# Patient Record
Sex: Male | Born: 1950 | Race: White | Hispanic: No | Marital: Married | Smoking: Never smoker
Health system: Southern US, Community
[De-identification: ages and names within clinical notes are randomized; demographics above are authoritative.]

## PROBLEM LIST (undated history)

## (undated) DIAGNOSIS — T7840XA Allergy, unspecified, initial encounter: Secondary | ICD-10-CM

## (undated) DIAGNOSIS — I10 Essential (primary) hypertension: Secondary | ICD-10-CM

## (undated) DIAGNOSIS — E785 Hyperlipidemia, unspecified: Secondary | ICD-10-CM

## (undated) HISTORY — DX: Hyperlipidemia, unspecified: E78.5

## (undated) HISTORY — DX: Allergy, unspecified, initial encounter: T78.40XA

## (undated) HISTORY — DX: Essential (primary) hypertension: I10

---

## 1996-07-05 HISTORY — PX: CHOLECYSTECTOMY: SHX55

## 2005-06-01 ENCOUNTER — Ambulatory Visit: Payer: Self-pay | Admitting: Urology

## 2006-08-19 ENCOUNTER — Ambulatory Visit: Payer: Self-pay | Admitting: General Surgery

## 2006-08-19 LAB — HM COLONOSCOPY

## 2006-10-25 IMAGING — CT CT ABDOMEN AND PELVIS WITHOUT AND WITH CONTRAST
2 of 4 series · 14 of 32 positions shown, 19 images · non-contrast
Comparison: none

REASON FOR EXAM: lower abd pain
COMMENTS:

[Series 4: with · axial · 0.73mm/px · z∈[-484,-124]mm · 8 of 59 slices shown, 13 images]
[im 7/59  soft-tissue]
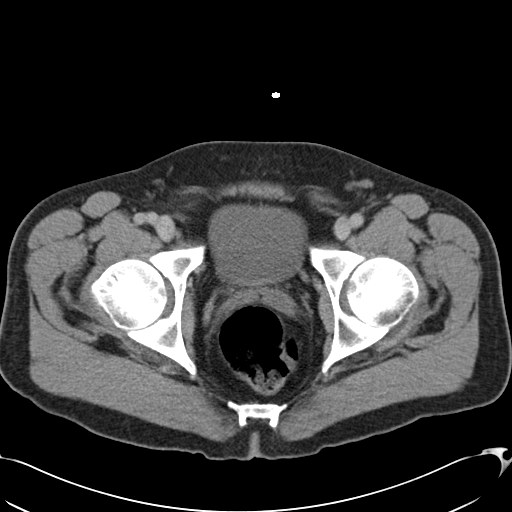
[im 7/59  bone]
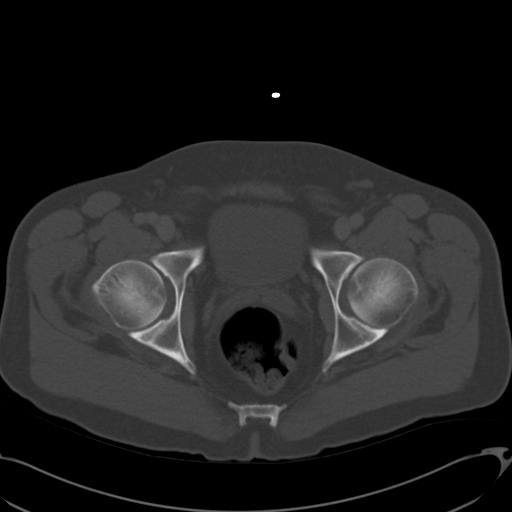
[im 13/59  soft-tissue]
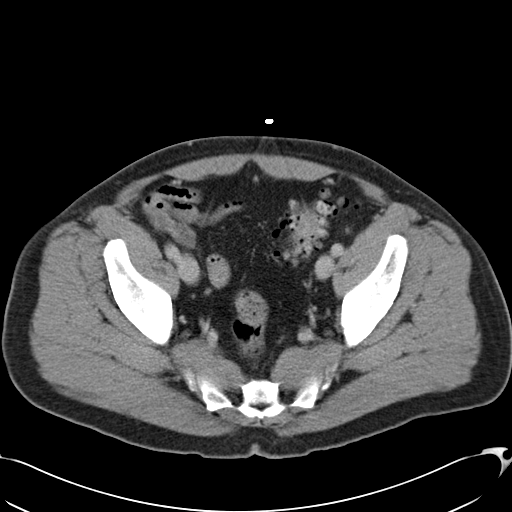
[im 20/59  soft-tissue]
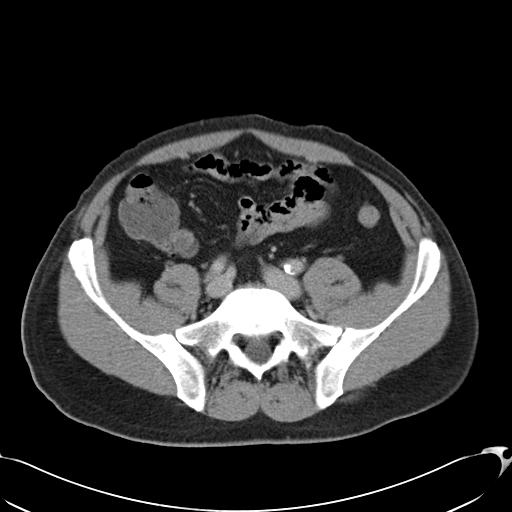
[im 26/59  soft-tissue]
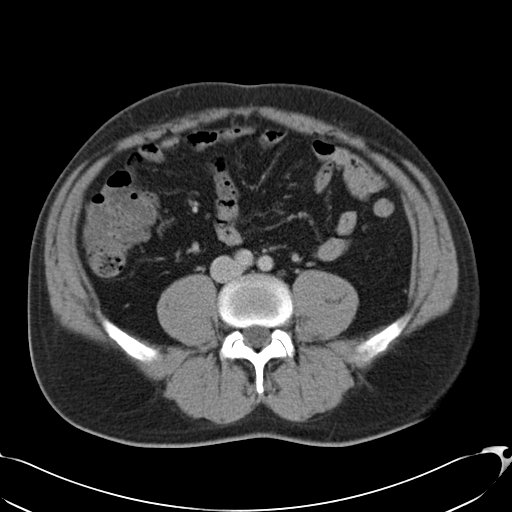
[im 33/59  soft-tissue]
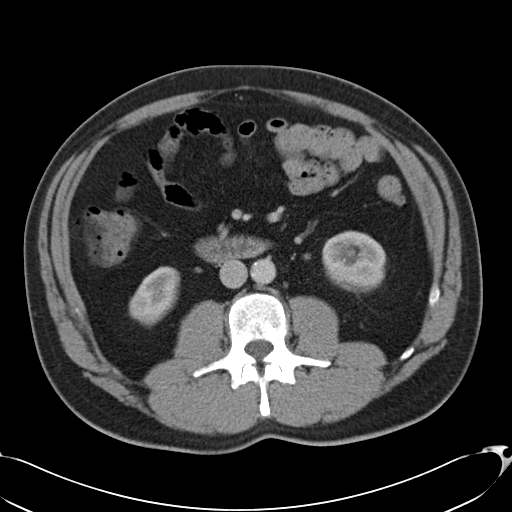
[im 33/59  lung]
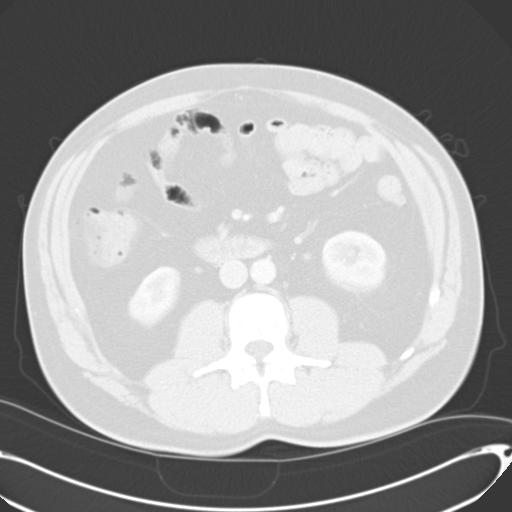
[im 39/59  soft-tissue]
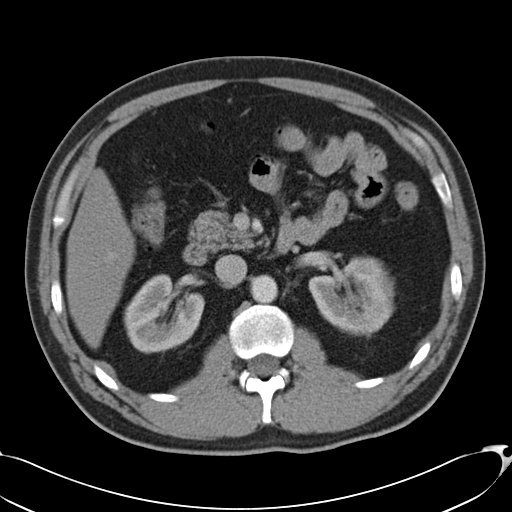
[im 39/59  lung]
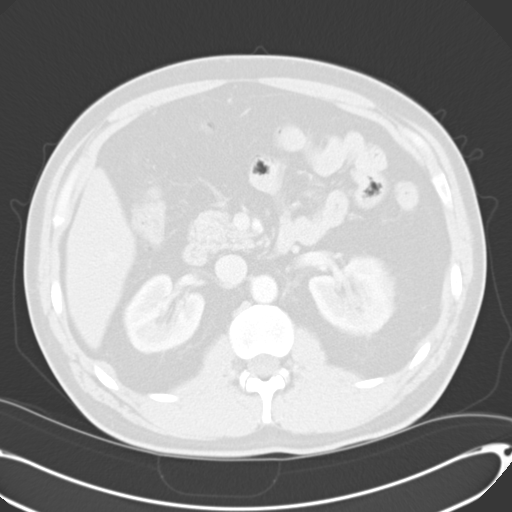
[im 46/59  soft-tissue]
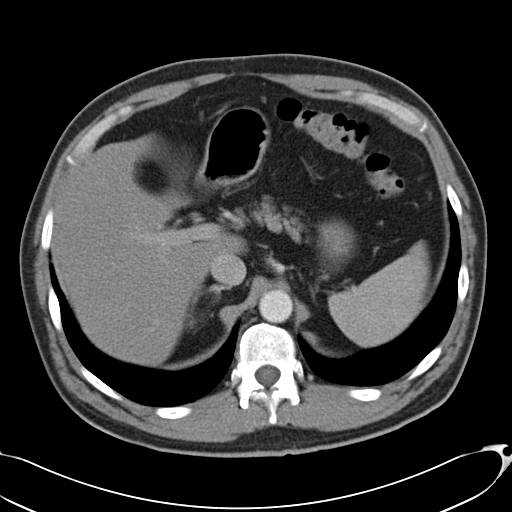
[im 46/59  lung]
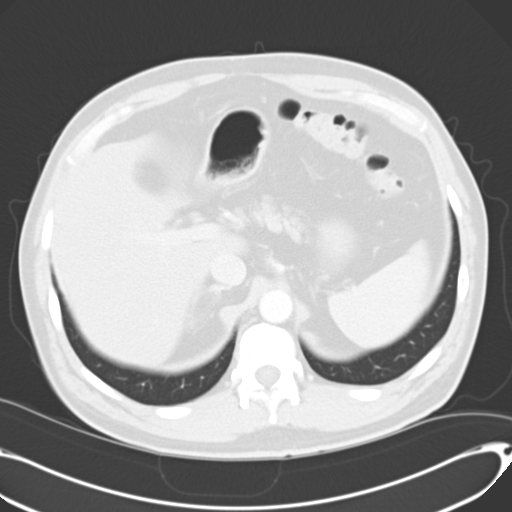
[im 52/59  soft-tissue]
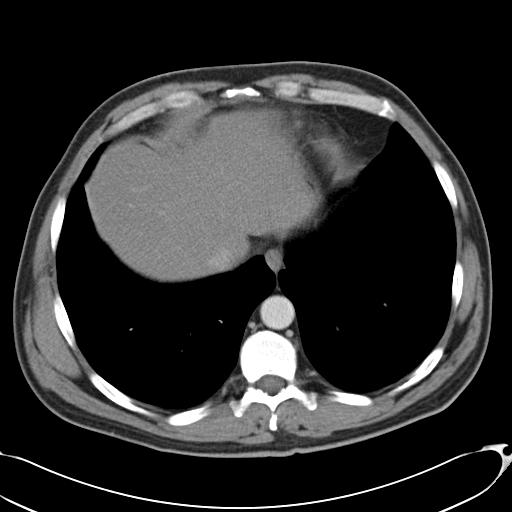
[im 52/59  lung]
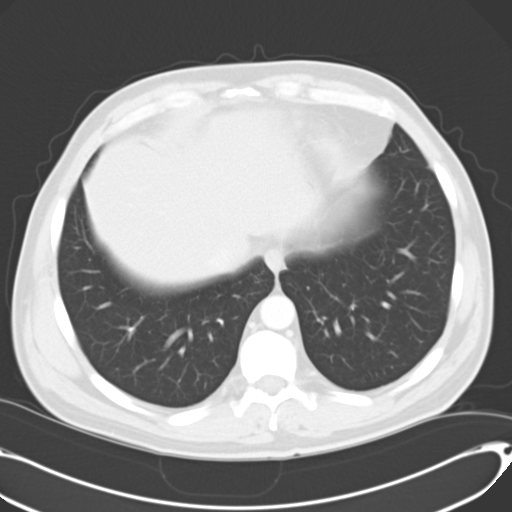

[Series 6: delay · axial · delayed · 0.73mm/px · z∈[-484,-228]mm · 6 of 59 slices shown]
[im 7/59  soft-tissue]
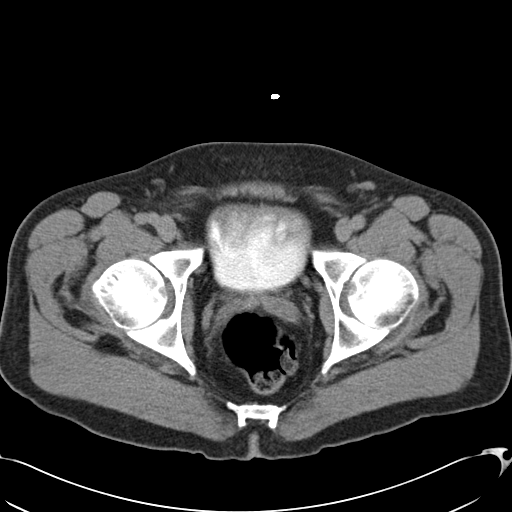
[im 13/59  soft-tissue]
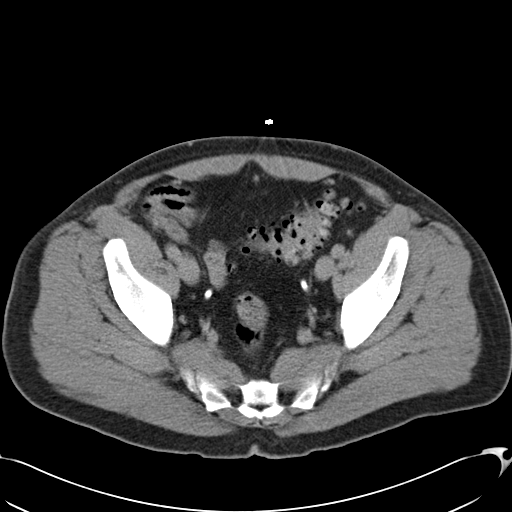
[im 20/59  soft-tissue]
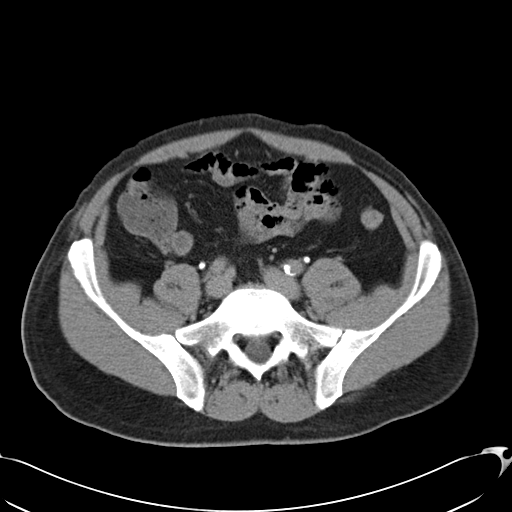
[im 26/59  soft-tissue]
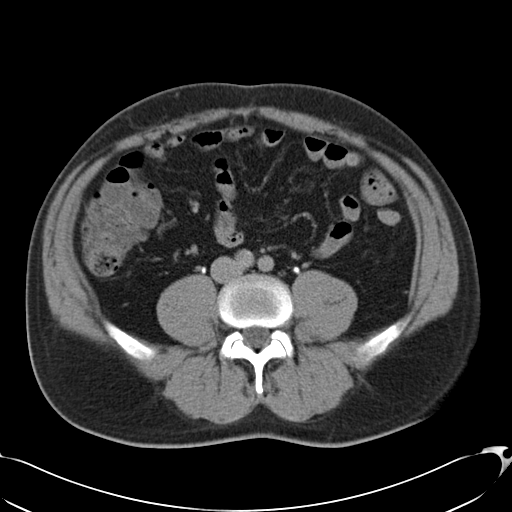
[im 33/59  soft-tissue]
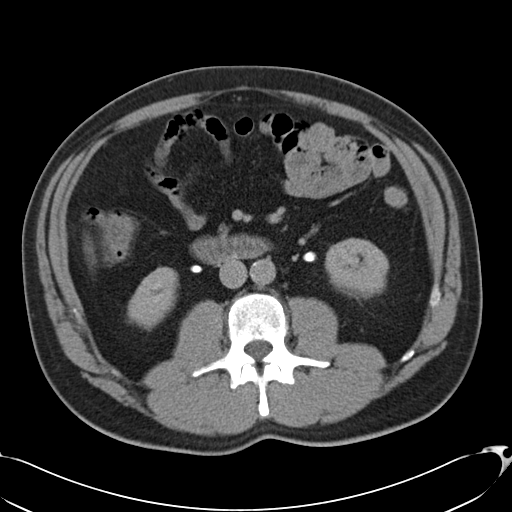
[im 39/59  soft-tissue]
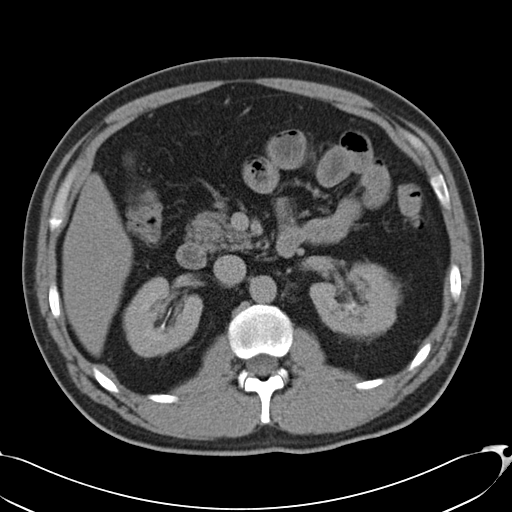

[14 of 32 positions shown; findings below may reference images not displayed]

PROCEDURE:     CT  - CT ABDOMEN / PELVIS  W/WO  - June 01, 2005  [DATE]

RESULT:     The patient has lower abdominal discomfort.  The study is
performed in a triphasic fashion. The liver exhibits normal density with the
exception of an area of irregular calcification inferomedially. This may be
the sequela of prior granulomatous infection. There are surgical clips in
the gallbladder fossa. There is no intrahepatic ductal dilation.  The
spleen, non-distended stomach, pancreas, adrenal glands, and kidneys are
normal in appearance. The periaortic and pericaval regions are normal. I see
no evidence of bowel obstruction or perforation nor inflammatory change.
There is no evidence of ascites. There are numerous diverticula associated
with the sigmoid colon, but I do not see objective evidence of acute
diverticulitis.  Subtle nonspecific thickening of the proximal and mid
sigmoid colon is seen, but this could merely reflect incomplete distention.
The seminal vesicles and prostate gland are normal in appearance as is the
urinary bladder.
IMPRESSION: 1)I do not see objective evidence of acute intraabdominal abnormality. There
is no evidence of urinary tract obstruction nor bowel obstruction.  A subtle
area of decreased density in the mid to lower pole of the RIGHT kidney has
Hounsfield measurements measuring 12 pre-contrast rising to 26 post contrast
and falling to 23 on the delayed images. This is most compatible with a
cyst.

2)There is mild thickening of the wall of the proximal and mid sigmoid
colon. This may merely reflect incomplete distention, but in the face of
numerous diverticula here, a low-grade diverticulitis could be present.

3)I do not see abnormality elsewhere within the abdomen or pelvis.

## 2015-01-18 ENCOUNTER — Ambulatory Visit
Admit: 2015-01-18 | Disposition: A | Discharge status: Home / Self Care | Payer: Self-pay | Attending: Family Medicine | Admitting: Family Medicine

## 2015-01-18 LAB — TSH: TSH: 3.34 u[IU]/mL (ref 0.41–5.90)

## 2015-01-18 LAB — CBC AND DIFFERENTIAL
HCT: 49 % (ref 41–53)
HEMOGLOBIN: 17.3 g/dL (ref 13.5–17.5)
NEUTROS ABS: 5 /uL
PLATELETS: 210 10*3/uL (ref 150–399)
WBC: 7.8 10^3/mL

## 2015-01-18 LAB — BASIC METABOLIC PANEL
BUN: 10 mg/dL (ref 4–21)
Creatinine: 1.2 mg/dL (ref 0.6–1.3)
Glucose: 112 mg/dL
Potassium: 4.8 mmol/L (ref 3.4–5.3)
SODIUM: 145 mmol/L (ref 137–147)

## 2015-01-18 LAB — LIPID PANEL
CHOLESTEROL: 147 mg/dL (ref 0–200)
HDL: 43 mg/dL (ref 35–70)
LDL CALC: 80 mg/dL
LDL/HDL RATIO: 1.9
TRIGLYCERIDES: 120 mg/dL (ref 40–160)

## 2015-01-18 LAB — HEPATIC FUNCTION PANEL
ALK PHOS: 98 U/L (ref 25–125)
ALT: 42 U/L — AB (ref 10–40)
AST: 26 U/L (ref 14–40)
Bilirubin, Total: 0.6 mg/dL

## 2015-01-18 LAB — PSA: PSA: 1.1

## 2015-04-29 ENCOUNTER — Other Ambulatory Visit: Payer: Self-pay | Admitting: Family Medicine

## 2015-05-24 ENCOUNTER — Other Ambulatory Visit: Payer: Self-pay | Admitting: Family Medicine

## 2015-08-22 ENCOUNTER — Ambulatory Visit: Payer: Self-pay | Admitting: Family Medicine

## 2015-08-22 DIAGNOSIS — E7800 Pure hypercholesterolemia, unspecified: Secondary | ICD-10-CM | POA: Insufficient documentation

## 2015-08-22 DIAGNOSIS — E669 Obesity, unspecified: Secondary | ICD-10-CM | POA: Insufficient documentation

## 2015-08-22 DIAGNOSIS — E559 Vitamin D deficiency, unspecified: Secondary | ICD-10-CM | POA: Insufficient documentation

## 2015-08-22 DIAGNOSIS — R7303 Prediabetes: Secondary | ICD-10-CM | POA: Insufficient documentation

## 2015-08-22 DIAGNOSIS — I10 Essential (primary) hypertension: Secondary | ICD-10-CM | POA: Insufficient documentation

## 2015-08-22 DIAGNOSIS — N400 Enlarged prostate without lower urinary tract symptoms: Secondary | ICD-10-CM | POA: Insufficient documentation

## 2015-08-22 DIAGNOSIS — J309 Allergic rhinitis, unspecified: Secondary | ICD-10-CM | POA: Insufficient documentation

## 2015-08-23 ENCOUNTER — Encounter: Payer: Self-pay | Admitting: Family Medicine

## 2015-08-23 ENCOUNTER — Ambulatory Visit (INDEPENDENT_AMBULATORY_CARE_PROVIDER_SITE_OTHER): Payer: PRIVATE HEALTH INSURANCE | Admitting: Family Medicine

## 2015-08-23 VITALS — BP 116/72 | HR 84 | Temp 97.8°F | Resp 16 | Ht 72.0 in | Wt 193.0 lb

## 2015-08-23 DIAGNOSIS — I10 Essential (primary) hypertension: Secondary | ICD-10-CM

## 2015-08-23 DIAGNOSIS — R739 Hyperglycemia, unspecified: Secondary | ICD-10-CM | POA: Diagnosis not present

## 2015-08-23 LAB — POCT GLYCOSYLATED HEMOGLOBIN (HGB A1C): HEMOGLOBIN A1C: 5.9

## 2015-08-23 NOTE — Progress Notes (Signed)
Patient ID: Jack Lutz, male   DOB: 1951-07-11, 64 y.o.   MRN: [id number]   Jack Lutz  MRN: [id number] DOB: Dec 25, 1950  Subjective:  HPI   1. Essential hypertension Patient is a 64 year old male who presents for follow up of his hypertension.  He was last seen on 02/24/15 and no management changes were made at that time.  He is on Lisinopril and reports no adverse effects.  He checks his blood pressure whenever he goes to the pharmacy and reports the readings ranging 120s/80-90s.  2. Hyperglycemia Patient is also here to follow up on elevated glucose that was noted on labs done after his last visit.  His fasting blood sugar was 112mg /dl in May.  He has not had an A1C done yet and is here to have that checked.  He is not on any medications and reports no symptoms suggestive of hypo or hyper glycemia.    Patient Active Problem List   Diagnosis Date Noted  . 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 Essential (primary) hypertension 08/22/2015  . Hypercholesterolemia 08/22/2015  . Adiposity 08/22/2015  . Allergic rhinitis 08/22/2015  . 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 Avitaminosis D 08/22/2015    No past medical history on file.  Social History   Social History  . Marital Status: Married    Spouse Name: N/A  . Number of Children: N/A  . Years of Education: N/A   Occupational History  . Not on file.   Social History Main Topics  . Smoking status: Never Smoker   . Smokeless tobacco: Not on file  . Alcohol Use: 0.0 oz/week    0 Standard drinks or equivalent per week     Comment: 3 per week  . Drug Use: No  . Sexual Activity: Not on file   Other Topics Concern  . Not on file   Social History Narrative    Outpatient Prescriptions Prior to Visit  Medication Sig Dispense Refill  . aspirin 81 MG tablet Take by mouth.    Marland Kitchen atorvastatin (LIPITOR) 40 MG tablet take 1 tablet by mouth once daily 30 tablet 4  . CALCIUM POLYCARBOPHIL PO Take by mouth.    .  Cholecalciferol (VITAMIN D) 2000 UNITS tablet Take by mouth.    . fluticasone (FLONASE) 50 MCG/ACT nasal spray Place into the nose.    Marland Kitchen lisinopril (PRINIVIL,ZESTRIL) 40 MG tablet take 1 tablet by mouth once daily 30 tablet 12  . loratadine (CLARITIN) 10 MG tablet Take by mouth.    . Multiple Vitamin tablet Take by mouth.     No facility-administered medications prior to visit.    No Known Allergies  Review of Systems  Constitutional: Negative.   Eyes: Negative.   Respiratory: Negative.   Cardiovascular: Negative.   Gastrointestinal: Negative.   Genitourinary: Negative for frequency.  Skin: Negative.   Neurological: Negative for dizziness (Some very minimal lightheadedness) and headaches.  Endo/Heme/Allergies: Negative for polydipsia.  Psychiatric/Behavioral: Negative.    Objective:  BP 116/72 mmHg  Pulse 84  Temp(Src) 97.8 F (36.6 C) (Oral)  Resp 16  Ht 6' (1.829 m)  Wt 193 lb (87.544 kg)  BMI 26.17 kg/m2  Physical Exam  Constitutional: He is well-developed, well-nourished, and in no distress.  HENT:  Head: Normocephalic and atraumatic.  Eyes: Conjunctivae and EOM are normal. Pupils are equal, round, and reactive to light.  No nystagmus  Neck: Normal range of motion. Neck supple.  Cardiovascular: Normal rate, regular rhythm and  normal heart sounds.   Pulmonary/Chest: Effort normal and breath sounds normal.  Musculoskeletal:  Small bunion and minimal swelling from history of trauma to the right foot.  Skin: Skin is warm and dry.  Psychiatric: Mood, memory, affect and judgment normal.    Assessment and Plan :   1. Essential hypertension   2. Hyperglycemia  - POCT glycosylated hemoglobin (Hb A1C)   Miguel Aschoff MD [facility] 08/23/2015 11:46 AM

## 2015-10-10 ENCOUNTER — Other Ambulatory Visit: Payer: Self-pay | Admitting: Family Medicine

## 2015-12-07 ENCOUNTER — Other Ambulatory Visit: Payer: Self-pay

## 2015-12-07 MED ORDER — ATORVASTATIN CALCIUM 40 MG PO TABS: 40.0000 mg | ORAL_TABLET | Freq: Every day | ORAL | Status: DC

## 2016-02-21 ENCOUNTER — Ambulatory Visit (INDEPENDENT_AMBULATORY_CARE_PROVIDER_SITE_OTHER): Payer: Medicare Other | Admitting: Family Medicine

## 2016-02-21 ENCOUNTER — Encounter: Payer: Self-pay | Admitting: Family Medicine

## 2016-02-21 VITALS — BP 134/68 | HR 64 | Temp 97.6°F | Resp 16 | Ht 72.0 in | Wt 192.0 lb

## 2016-02-21 DIAGNOSIS — Z23 Encounter for immunization: Secondary | ICD-10-CM | POA: Diagnosis not present

## 2016-02-21 DIAGNOSIS — M545 Low back pain: Secondary | ICD-10-CM

## 2016-02-21 DIAGNOSIS — Z0000 Encounter for general adult medical examination without abnormal findings: Secondary | ICD-10-CM

## 2016-02-21 MED ORDER — NAPROXEN 500 MG PO TABS: 500.0000 mg | ORAL_TABLET | Freq: Two times a day (BID) | ORAL | Status: DC

## 2016-02-21 NOTE — Progress Notes (Signed)
Patient ID: Jack Lutz, male   DOB: 1951-01-01, 65 y.o.   MRN: [id number] Visit Date: 02/21/2016  Today's Provider: Wilhemena Durie, MD   Chief Complaint  Patient presents with  . Medicare Wellness  . Hyperlipidemia  . Hypertension   Subjective:   Jack Lutz is a 65 y.o. male who presents today for his Subsequent Annual Wellness Visit. He feels well. He reports exercising daily. He reports he is sleeping well.  Colonoscopy- 08/19/06 Diverticulosis petechial mucosa in the sigmoid colon repeat 10 years  Pt is due for Pneumovax-23  Immunization History  Administered Date(s) Administered  . Pneumococcal Conjugate-13 01/18/2015  . Tdap 10/30/2010  . Zoster 10/30/2010      Review of Systems  Constitutional: Negative.   HENT: Negative.   Eyes: Negative.   Respiratory: Negative.   Cardiovascular: Negative.   Gastrointestinal: Negative.   Endocrine: Negative.   Genitourinary: Negative.   Musculoskeletal: Negative.   Skin: Negative.   Allergic/Immunologic: Negative.   Neurological: Negative.   Hematological: Negative.   Psychiatric/Behavioral: Negative.     Patient Active Problem List   Diagnosis Date Noted  . 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 Essential (primary) hypertension 08/22/2015  . Hypercholesterolemia 08/22/2015  . Adiposity 08/22/2015  . Allergic rhinitis 08/22/2015  . 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 Avitaminosis D 08/22/2015    Social History   Social History  . Marital Status: Married    Spouse Name: N/A  . Number of Children: N/A  . Years of Education: N/A   Occupational History  . Not on file.   Social History Main Topics  . Smoking status: Never Smoker   . Smokeless tobacco: Not on file  . Alcohol Use: 0.0 oz/week    0 Standard drinks or equivalent per week     Comment: 3 per week- occasionally  . Drug Use: No  . Sexual Activity: Not on file   Other Topics Concern  . Not on file   Social History Narrative     Past Surgical History  Procedure Laterality Date  . Cholecystectomy      His family history includes Emphysema in his mother; Heart attack in his brother, brother, father, and sister; Heart disease in his mother and sister; Other in his brother.    Outpatient Prescriptions Prior to Visit  Medication Sig Dispense Refill  . aspirin 81 MG tablet Take by mouth.    Marland Kitchen atorvastatin (LIPITOR) 40 MG tablet Take 1 tablet (40 mg total) by mouth daily. 90 tablet 3  . CALCIUM POLYCARBOPHIL PO Take by mouth.    . Cholecalciferol (VITAMIN D) 2000 UNITS tablet Take by mouth.    . Coenzyme Q10 (CO Q-10) 100 MG CAPS Take 1 capsule by mouth daily.    . fluticasone (FLONASE) 50 MCG/ACT nasal spray instill 2 sprays into each nostril once daily 16 g 12  . lisinopril (PRINIVIL,ZESTRIL) 40 MG tablet take 1 tablet by mouth once daily 30 tablet 12  . loratadine (CLARITIN) 10 MG tablet Take by mouth.    . Multiple Vitamin tablet Take by mouth.     No facility-administered medications prior to visit.    No Known Allergies  Patient Care Team: Jerrol Banana., MD as PCP - General (Family Medicine)  Objective:   Vitals:  Filed Vitals:   02/21/16 1025  BP: 134/68  Pulse: 64  Temp: 97.6 F (36.4 C)  TempSrc: Oral  Resp: 16  Height: 6' (1.829 m)  Weight: 192 lb (  87.091 kg)    Physical Exam  Constitutional: He is oriented to person, place, and time. He appears well-developed and well-nourished.  HENT:  Head: Normocephalic and atraumatic.  Right Ear: External ear normal.  Left Ear: External ear normal.  Nose: Nose normal.  Mouth/Throat: Oropharynx is clear and moist.  Eyes: Conjunctivae and EOM are normal. Pupils are equal, round, and reactive to light.  Neck: Normal range of motion. Neck supple.  Cardiovascular: Normal rate, regular rhythm, normal heart sounds and intact distal pulses.   Pulmonary/Chest: Effort normal and breath sounds normal.  Abdominal: Soft. Bowel sounds are normal.   Genitourinary: Rectum normal, prostate normal and penis normal.  Musculoskeletal: Normal range of motion.  Neurological: He is alert and oriented to person, place, and time. He has normal reflexes.  Skin: Skin is warm and dry.  Psychiatric: He has a normal mood and affect. His behavior is normal. Judgment and thought content normal.    Activities of Daily Living In your present state of health, do you have any difficulty performing the following activities: 02/21/2016  Hearing? N  Vision? N  Difficulty concentrating or making decisions? N  Walking or climbing stairs? N  Dressing or bathing? N  Doing errands, shopping? N    Fall Risk Assessment Fall Risk  02/21/2016  Falls in the past year? No     Depression Screen PHQ 2/9 Scores 02/21/2016  PHQ - 2 Score 0    Cognitive Testing - 6-CIT    Year: 0  points  Month: 0  points  Memorize "Pia Mau, [address] [place]"  Time (within 1 hour:) 0  points  Count backwards from 20: 0  points  Name months of year: 0 points  Repeat Address: 2 points   Total Score: 2/28  Interpretation : Normal (0-7) Abnormal (8-28)    Assessment & Plan:     Annual Wellness Visit  Reviewed patient's Family Medical History Reviewed and updated list of patient's medical providers Assessment of cognitive impairment was done Assessed patient's functional ability Established a written schedule for health screening Jack Lutz Completed and Reviewed  Patient overall feels well. He retired work as a 10/09/2015 is and is enjoying retirement. Immunization History  Administered Date(s) Administered  . Pneumococcal Conjugate-13 01/18/2015  . Tdap 10/30/2010  . Zoster 10/30/2010    1. Medicare annual wellness visit, initial  - EKG 12-Lead - Visual acuity screening  2. Need for pneumococcal vaccination - Pneumococcal polysaccharide vaccine 23-valent greater than or equal to 2yo subcutaneous/IM  3. Low back pain,  unspecified back pain laterality, with sciatica presence unspecified - naproxen (NAPROSYN) 500 MG tablet; Take 1 tablet (500 mg total) by mouth 2 (two) times daily with a meal.  Dispense: 60 tablet; Refill: 5    Discussed health benefits of physical activity, and encouraged him to engage in regular exercise appropriate for his age and condition.    Miguel Aschoff MD [facility] 02/21/2016 10:30 AM  ------------------------------------------------------------------------------------------------------------

## 2016-05-03 ENCOUNTER — Ambulatory Visit (INDEPENDENT_AMBULATORY_CARE_PROVIDER_SITE_OTHER): Payer: Medicare Other | Admitting: Family Medicine

## 2016-05-03 VITALS — BP 140/82 | HR 72 | Temp 97.5°F | Wt 192.0 lb

## 2016-05-03 DIAGNOSIS — E7800 Pure hypercholesterolemia, unspecified: Secondary | ICD-10-CM

## 2016-05-03 DIAGNOSIS — R7303 Prediabetes: Secondary | ICD-10-CM

## 2016-05-03 DIAGNOSIS — L723 Sebaceous cyst: Secondary | ICD-10-CM | POA: Diagnosis not present

## 2016-05-03 DIAGNOSIS — I10 Essential (primary) hypertension: Secondary | ICD-10-CM | POA: Diagnosis not present

## 2016-05-03 DIAGNOSIS — M791 Myalgia, unspecified site: Secondary | ICD-10-CM

## 2016-05-03 NOTE — Progress Notes (Signed)
Jack Lutz  MRN: [id number] DOB: May 24, 1951  Subjective:  HPI   The patient is a 65 year old male who presents for follow up of chronic issues.  He was last seen in May for his Annual Wellness Exam, [facility] to [employer].   Hypertension- the patient checks his pressure at the pharmacy when he goes and reports his readings 120-130's over 80s with an occasional 90.  His pressure on his last visit with [place] was 134/68.  BP Readings from Last 3 Encounters:  05/03/16 140/82  02/21/16 134/68  08/23/15 116/72   Lab Results  Component Value Date   CREATININE 1.2 01/18/2015   BUN 10 01/18/2015   NA 145 01/18/2015   K 4.8 01/18/2015   He has also been diagnosed with borderline diabetes.  He does not check his glucose at home. Lab Results  Component Value Date   HGBA1C 5.9 08/23/2015   Patient is also on cholesterol medications and needs to have this checked today.  Lab Results  Component Value Date   CHOL 147 01/18/2015   Lab Results  Component Value Date   HDL 43 01/18/2015   Lab Results  Component Value Date   LDLCALC 80 01/18/2015   Lab Results  Component Value Date   TRIG 120 01/18/2015     Patient Active Problem List   Diagnosis Date Noted  . 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 Essential (primary) hypertension 08/22/2015  . Hypercholesterolemia 08/22/2015  . Adiposity 08/22/2015  . Allergic rhinitis 08/22/2015  . 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 Avitaminosis D 08/22/2015    No past medical history on file.  Social History   Social History  . Marital status: Married    Spouse name: N/A  . Number of children: N/A  . Years of education: N/A   Occupational History  . Not on file.   Social History Main Topics  . Smoking status: Never Smoker  . Smokeless tobacco: Not on file  . Alcohol use 0.0 oz/week     Comment: 3 per week- occasionally  . Drug use: No  . Sexual activity: Not on file   Other Topics  Concern  . Not on file   Social History Narrative  . No narrative on file    Outpatient Medications Prior to Visit  Medication Sig Dispense Refill  . aspirin 81 MG tablet Take by mouth.    Marland Kitchen atorvastatin (LIPITOR) 40 MG tablet Take 1 tablet (40 mg total) by mouth daily. 90 tablet 3  . CALCIUM POLYCARBOPHIL PO Take by mouth.    . Cholecalciferol (VITAMIN D) 2000 UNITS tablet Take by mouth.    . Coenzyme Q10 (CO Q-10) 100 MG CAPS Take 1 capsule by mouth daily.    . fluticasone (FLONASE) 50 MCG/ACT nasal spray instill 2 sprays into each nostril once daily 16 g 12  . lisinopril (PRINIVIL,ZESTRIL) 40 MG tablet take 1 tablet by mouth once daily 30 tablet 12  . loratadine (CLARITIN) 10 MG tablet Take by mouth.    . Multiple Vitamin tablet Take by mouth.    . naproxen (NAPROSYN) 500 MG tablet Take 1 tablet (500 mg total) by mouth 2 (two) times daily with a meal. 60 tablet 5   No facility-administered medications prior to visit.     No Known Allergies  Review of Systems  Constitutional: Negative for chills, fever and malaise/fatigue.  Respiratory: Negative for cough, shortness of breath and wheezing.   Cardiovascular: Negative  for chest pain, palpitations, orthopnea, claudication, leg swelling and PND.  Genitourinary: Negative for urgency.  Neurological: Negative for dizziness, weakness and headaches.  Endo/Heme/Allergies: Negative.  Negative for polydipsia.  Psychiatric/Behavioral: Negative.    Objective:  BP 140/82 (BP Location: Right Arm, Patient Position: Sitting, Cuff Size: Normal)   Pulse 72   Temp 97.5 F (36.4 C) (Oral)   Wt 192 lb (87.1 kg)   BMI 26.04 kg/m   Physical Exam  Constitutional: He is oriented to person, place, and time and well-developed, well-nourished, and in no distress.  HENT:  Head: Normocephalic and atraumatic.  Right Ear: External ear normal.  Left Ear: External ear normal.  Nose: Nose normal.  Eyes: Conjunctivae are normal. Pupils are equal,  round, and reactive to light.  Neck: Normal range of motion.  Cardiovascular: Normal rate, regular rhythm, normal heart sounds and intact distal pulses.   Pulmonary/Chest: Effort normal and breath sounds normal.  Neurological: He is alert and oriented to person, place, and time. Gait normal.  Skin: Skin is warm and dry.  Sebaceous Cyst present on left posterior rib area.  Psychiatric: Mood, memory, affect and judgment normal.    Assessment and Plan :   1. Essential (primary) hypertension Stable continue current medication.  - CBC with Differential/Platelet - Comprehensive metabolic panel - TSH  2. Hypercholesterolemia Stable on last check, will check today.  - Lipid Panel With LDL/HDL Ratio  3. Borderline diabetes Will obtain A1C today.  - Hemoglobin A1c  4. Myalgia Will check CK today.  - CK  5. Sebaceous cyst Small cyst on left back, patient declined treatment today.   I have done the exam and reviewed the above chart and it is accurate to the best of my knowledge.  Patient was seen and examined by Dr. Delfino Lovett L. [place] Mon and the note was scribed by Althea Charon, RMA.  Miguel Aschoff MD [facility] 05/03/2016 8:21 AM

## 2016-05-04 ENCOUNTER — Other Ambulatory Visit: Payer: Self-pay | Admitting: Family Medicine

## 2016-05-04 LAB — CBC WITH DIFFERENTIAL/PLATELET
BASOS ABS: 0 10*3/uL (ref 0.0–0.2)
BASOS: 0 %
EOS (ABSOLUTE): 0.1 10*3/uL (ref 0.0–0.4)
Eos: 2 %
Hematocrit: 49.6 % (ref 37.5–51.0)
Hemoglobin: 16.6 g/dL (ref 12.6–17.7)
IMMATURE GRANS (ABS): 0 10*3/uL (ref 0.0–0.1)
Immature Granulocytes: 0 %
LYMPHS ABS: 1.9 10*3/uL (ref 0.7–3.1)
LYMPHS: 25 %
MCH: 30.8 pg (ref 26.6–33.0)
MCHC: 33.5 g/dL (ref 31.5–35.7)
MCV: 92 fL (ref 79–97)
Monocytes Absolute: 0.5 10*3/uL (ref 0.1–0.9)
Monocytes: 7 %
NEUTROS ABS: 5 10*3/uL (ref 1.4–7.0)
Neutrophils: 66 %
PLATELETS: 217 10*3/uL (ref 150–379)
RBC: 5.39 x10E6/uL (ref 4.14–5.80)
RDW: 13.4 % (ref 12.3–15.4)
WBC: 7.6 10*3/uL (ref 3.4–10.8)

## 2016-05-04 LAB — LIPID PANEL WITH LDL/HDL RATIO
CHOLESTEROL TOTAL: 105 mg/dL (ref 100–199)
HDL: 42 mg/dL (ref >39)
LDL CALC: 49 mg/dL (ref 0–99)
LDl/HDL Ratio: 1.2 ratio units (ref 0.0–3.6)
TRIGLYCERIDES: 71 mg/dL (ref 0–149)
VLDL CHOLESTEROL CAL: 14 mg/dL (ref 5–40)

## 2016-05-04 LAB — COMPREHENSIVE METABOLIC PANEL
ALK PHOS: 84 IU/L (ref 39–117)
ALT: 24 IU/L (ref 0–44)
AST: 19 IU/L (ref 0–40)
Albumin/Globulin Ratio: 2 (ref 1.2–2.2)
Albumin: 4.6 g/dL (ref 3.6–4.8)
BILIRUBIN TOTAL: 0.7 mg/dL (ref 0.0–1.2)
BUN/Creatinine Ratio: 14 (ref 10–24)
BUN: 15 mg/dL (ref 8–27)
CHLORIDE: 100 mmol/L (ref 96–106)
CO2: 22 mmol/L (ref 18–29)
CREATININE: 1.1 mg/dL (ref 0.76–1.27)
Calcium: 9.8 mg/dL (ref 8.6–10.2)
GFR calc Af Amer: 80 mL/min/{1.73_m2} (ref >59)
GFR calc non Af Amer: 70 mL/min/{1.73_m2} (ref >59)
GLUCOSE: 110 mg/dL — AB (ref 65–99)
Globulin, Total: 2.3 g/dL (ref 1.5–4.5)
Potassium: 4.7 mmol/L (ref 3.5–5.2)
Sodium: 138 mmol/L (ref 134–144)
Total Protein: 6.9 g/dL (ref 6.0–8.5)

## 2016-05-04 LAB — CK: Total CK: 132 U/L (ref 24–204)

## 2016-05-04 LAB — HEMOGLOBIN A1C
ESTIMATED AVERAGE GLUCOSE: 134 mg/dL
HEMOGLOBIN A1C: 6.3 % — AB (ref 4.8–5.6)

## 2016-05-04 LAB — TSH: TSH: 3.73 u[IU]/mL (ref 0.450–4.500)

## 2016-05-11 ENCOUNTER — Telehealth: Payer: Self-pay | Admitting: Family Medicine

## 2016-05-11 NOTE — Telephone Encounter (Signed)
Pt called wanting his lab results from last week.  He has not gotten a call back regarding the results.  His call back is [phone]  Thanks Con Memos

## 2016-05-11 NOTE — Telephone Encounter (Signed)
Pt advised of lab results.   Thanks,   -Laura  

## 2016-07-23 DIAGNOSIS — Z23 Encounter for immunization: Secondary | ICD-10-CM | POA: Diagnosis not present

## 2016-10-30 ENCOUNTER — Other Ambulatory Visit: Payer: Self-pay | Admitting: Family Medicine

## 2016-11-08 ENCOUNTER — Encounter: Payer: Self-pay | Admitting: Family Medicine

## 2016-11-08 ENCOUNTER — Ambulatory Visit (INDEPENDENT_AMBULATORY_CARE_PROVIDER_SITE_OTHER): Payer: Medicare Other | Admitting: Family Medicine

## 2016-11-08 VITALS — BP 122/64 | HR 88 | Temp 98.7°F | Resp 16 | Wt 180.0 lb

## 2016-11-08 DIAGNOSIS — R059 Cough, unspecified: Secondary | ICD-10-CM

## 2016-11-08 DIAGNOSIS — R091 Pleurisy: Secondary | ICD-10-CM

## 2016-11-08 DIAGNOSIS — R05 Cough: Secondary | ICD-10-CM

## 2016-11-08 MED ORDER — HYDROCODONE-ACETAMINOPHEN 5-325 MG PO TABS: 1.0000 | ORAL_TABLET | ORAL | 0 refills | Status: DC | PRN

## 2016-11-08 MED ORDER — AZITHROMYCIN 250 MG PO TABS: ORAL_TABLET | ORAL | 1 refills | Status: DC

## 2016-11-08 NOTE — Progress Notes (Signed)
Patient: Jack Lutz Male    DOB: [birth year]   66 y.o.   MRN: [id number] Visit Date: 11/08/2016  Today's Provider: Wilhemena Durie, MD   Chief Complaint  Patient presents with  . URI    X 2 weeks.    Subjective:    URI   This is a new problem. The current episode started 1 to 4 weeks ago. The problem has been unchanged. Associated symptoms include congestion, coughing, headaches, rhinorrhea and sneezing. Pertinent negatives include no wheezing. He has tried decongestant and antihistamine for the symptoms. The treatment provided mild relief.       No Known Allergies   Current Outpatient Prescriptions:  .  aspirin 81 MG tablet, Take by mouth., Disp: , Rfl:  .  atorvastatin (LIPITOR) 40 MG tablet, Take 1 tablet (40 mg total) by mouth daily., Disp: 90 tablet, Rfl: 3 .  CALCIUM POLYCARBOPHIL PO, Take by mouth., Disp: , Rfl:  .  Cholecalciferol (VITAMIN D) 2000 UNITS tablet, Take by mouth., Disp: , Rfl:  .  Coenzyme Q10 (CO Q-10) 100 MG CAPS, Take 1 capsule by mouth daily., Disp: , Rfl:  .  fluticasone (FLONASE) 50 MCG/ACT nasal spray, instill 2 sprays into each nostril once daily, Disp: 16 g, Rfl: 12 .  lisinopril (PRINIVIL,ZESTRIL) 40 MG tablet, take 1 tablet by mouth once daily, Disp: 30 tablet, Rfl: 12 .  loratadine (CLARITIN) 10 MG tablet, Take by mouth., Disp: , Rfl:  .  Multiple Vitamin tablet, Take by mouth., Disp: , Rfl:  .  naproxen (NAPROSYN) 500 MG tablet, Take 1 tablet (500 mg total) by mouth 2 (two) times daily with a meal., Disp: 60 tablet, Rfl: 5  Review of Systems  HENT: Positive for congestion, rhinorrhea and sneezing.   Eyes: Negative.   Respiratory: Positive for cough. Negative for wheezing.   Gastrointestinal: Negative.   Endocrine: Negative.   Allergic/Immunologic: Negative.   Neurological: Positive for headaches.  Psychiatric/Behavioral: Negative.     Social History  Substance Use Topics  . Smoking status: Never Smoker  . Smokeless  tobacco: Never Used  . Alcohol use 0.0 oz/week     Comment: 3 per week- occasionally   Objective:   BP 122/64 (BP Location: Right Arm, Patient Position: Sitting, Cuff Size: Normal)   Pulse 88   Temp 98.7 F (37.1 C)   Resp 16   Wt 180 lb (81.6 kg)   SpO2 98%   BMI 24.41 kg/m   Physical Exam  Constitutional: He is oriented to person, place, and time. He appears well-developed and well-nourished.  HENT:  Head: Normocephalic and atraumatic.  Right Ear: External ear normal.  Left Ear: External ear normal.  Nose: Nose normal.  Mouth/Throat: Oropharynx is clear and moist.  Neck: Normal range of motion. Neck supple.  Cardiovascular: Normal rate, regular rhythm and normal heart sounds.   Pulmonary/Chest: Effort normal and breath sounds normal.  Neurological: He is alert and oriented to person, place, and time.  Skin: Skin is warm and dry.  Psychiatric: He has a normal mood and affect. His behavior is normal. Judgment and thought content normal.        Assessment & Plan:     1. Cough  - azithromycin (ZITHROMAX) 250 MG tablet; Take 2 tablets today, then 1 tablet thereafter.  Dispense: 6 tablet; Refill: 1 - HYDROcodone-acetaminophen (NORCO/VICODIN) 5-325 MG tablet; Take 1 tablet by mouth every 4 (four) hours as needed for moderate pain.  Dispense: 30  tablet; Refill: 0  2. Pleurisy 3.Bronchitis Zpak.  I have done the exam and reviewed the chart and it is accurate to the best of my knowledge. Development worker, community has been used and  any errors in dictation or transcription are unintentional. Miguel Aschoff M.D. [place], MD  [facility]

## 2016-11-08 NOTE — Patient Instructions (Signed)
Take naproxen and tylenol for headache.

## 2016-11-22 ENCOUNTER — Other Ambulatory Visit: Payer: Self-pay | Admitting: Family Medicine

## 2017-02-26 ENCOUNTER — Encounter: Payer: Self-pay | Admitting: Family Medicine

## 2017-02-26 ENCOUNTER — Ambulatory Visit (INDEPENDENT_AMBULATORY_CARE_PROVIDER_SITE_OTHER): Payer: Medicare Other | Admitting: Family Medicine

## 2017-02-26 VITALS — BP 108/70 | HR 56 | Temp 98.0°F | Resp 16 | Wt 179.0 lb

## 2017-02-26 DIAGNOSIS — E7800 Pure hypercholesterolemia, unspecified: Secondary | ICD-10-CM

## 2017-02-26 DIAGNOSIS — R7303 Prediabetes: Secondary | ICD-10-CM | POA: Diagnosis not present

## 2017-02-26 DIAGNOSIS — I10 Essential (primary) hypertension: Secondary | ICD-10-CM

## 2017-02-26 NOTE — Progress Notes (Signed)
Patient: Jack Lutz Male    DOB: 06-16-1951   66 y.o.   MRN: [id number] Visit Date: 02/26/2017  Today's Provider: Wilhemena Durie, MD   Chief Complaint  Patient presents with  . Hypertension  . Hyperlipidemia  . Hyperglycemia   Subjective:    HPI  Hypertension, follow-up:  BP Readings from Last 3 Encounters:  02/26/17 108/70  11/08/16 122/64  05/03/16 140/82    He was last seen for hypertension 10 months ago.  BP at that visit was 122/64. Management since that visit includes no changes. He reports good compliance with treatment. He is not having side effects.  He is not exercising. However, the patient does stay active. He is adherent to low salt diet.   Outside blood pressures are checked occasionally. He is experiencing none.  Patient denies exertional chest pressure/discomfort, lower extremity edema and palpitations.   Cardiovascular risk factors include dyslipidemia.   Weight trend: stable Wt Readings from Last 3 Encounters:  02/26/17 179 lb (81.2 kg)  11/08/16 180 lb (81.6 kg)  05/03/16 192 lb (87.1 kg)    Current diet: well balanced    Lipid/Cholesterol, Follow-up:   Last seen for this10 months ago.  Management changes since that visit include no changes. . Last Lipid Panel:    Component Value Date/Time   CHOL 105 05/03/2016 0916   TRIG 71 05/03/2016 0916   HDL 42 05/03/2016 0916   LDLCALC 49 05/03/2016 0916    Risk factors for vascular disease include hypertension  He reports good compliance with treatment. He is not having side effects.  Current symptoms include none and have been stable. Weight trend: stable Prior visit with dietician: no Current diet: well balanced Current exercise: no regular exercise  Wt Readings from Last 3 Encounters:  02/26/17 179 lb (81.2 kg)  11/08/16 180 lb (81.6 kg)  05/03/16 192 lb (87.1 kg)      Prediabetes, Follow-up:   Lab Results  Component Value Date   HGBA1C 6.3 (H) 05/03/2016     HGBA1C 5.9 08/23/2015   GLUCOSE 110 (H) 05/03/2016    Last seen for for this10 months ago.  Management since that visit includes increase exercise and monitor diet. Current symptoms include none and have been stable.  Weight trend: stable Prior visit with dietician: no Current diet: well balanced Current exercise: no regular exercise  Pertinent Labs:    Component Value Date/Time   CHOL 105 05/03/2016 0916   TRIG 71 05/03/2016 0916   CREATININE 1.10 05/03/2016 0916    Wt Readings from Last 3 Encounters:  02/26/17 179 lb (81.2 kg)  11/08/16 180 lb (81.6 kg)  05/03/16 192 lb (87.1 kg)         No Known Allergies   Current Outpatient Prescriptions:  .  aspirin 81 MG tablet, Take by mouth., Disp: , Rfl:  .  atorvastatin (LIPITOR) 40 MG tablet, take 1 tablet by mouth once daily, Disp: 90 tablet, Rfl: 3 .  CALCIUM POLYCARBOPHIL PO, Take by mouth., Disp: , Rfl:  .  Cholecalciferol (VITAMIN D) 2000 UNITS tablet, Take by mouth., Disp: , Rfl:  .  Coenzyme Q10 (CO Q-10) 100 MG CAPS, Take 1 capsule by mouth daily., Disp: , Rfl:  .  fluticasone (FLONASE) 50 MCG/ACT nasal spray, instill 2 sprays into each nostril once daily, Disp: 16 g, Rfl: 12 .  HYDROcodone-acetaminophen (NORCO/VICODIN) 5-325 MG tablet, Take 1 tablet by mouth every 4 (four) hours as needed for moderate pain.,  Disp: 30 tablet, Rfl: 0 .  lisinopril (PRINIVIL,ZESTRIL) 40 MG tablet, take 1 tablet by mouth once daily, Disp: 30 tablet, Rfl: 12 .  loratadine (CLARITIN) 10 MG tablet, Take by mouth., Disp: , Rfl:  .  Multiple Vitamin tablet, Take by mouth., Disp: , Rfl:  .  naproxen (NAPROSYN) 500 MG tablet, Take 1 tablet (500 mg total) by mouth 2 (two) times daily with a meal., Disp: 60 tablet, Rfl: 5  Review of Systems  Constitutional: Negative.   Respiratory: Negative.   Cardiovascular: Negative.   Endocrine: Negative.   Musculoskeletal: Negative.   Neurological: Negative.     Social History  Substance Use  Topics  . Smoking status: Never Smoker  . Smokeless tobacco: Never Used  . Alcohol use 0.0 oz/week     Comment: 3 per week- occasionally   Objective:   BP 108/70 (BP Location: Right Arm, Patient Position: Sitting, Cuff Size: Normal)   Pulse (!) 56   Temp 98 F (36.7 C)   Resp 16   Wt 179 lb (81.2 kg)   SpO2 96%   BMI 24.28 kg/m  Vitals:   02/26/17 0842  BP: 108/70  Pulse: (!) 56  Resp: 16  Temp: 98 F (36.7 C)  SpO2: 96%  Weight: 179 lb (81.2 kg)     Physical Exam      Assessment & Plan:     1. Essential (primary) hypertension  - Comprehensive metabolic panel - TSH  2. Hypercholesterolemia  - Lipid panel  3. Borderline diabetes  - CBC with Differential/Platelet - Hemoglobin A1c       I have done the exam and reviewed the above chart and it is accurate to the best of my knowledge. Development worker, community has been used in this note in any air is in the dictation or transcription are unintentional.  Wilhemena Durie, MD  [place]

## 2017-02-27 LAB — COMPREHENSIVE METABOLIC PANEL
ALBUMIN: 4.6 g/dL (ref 3.6–4.8)
ALT: 19 IU/L (ref 0–44)
AST: 18 IU/L (ref 0–40)
Albumin/Globulin Ratio: 1.9 (ref 1.2–2.2)
Alkaline Phosphatase: 73 IU/L (ref 39–117)
BILIRUBIN TOTAL: 0.5 mg/dL (ref 0.0–1.2)
BUN / CREAT RATIO: 18 (ref 10–24)
BUN: 19 mg/dL (ref 8–27)
CO2: 25 mmol/L (ref 18–29)
Calcium: 9.9 mg/dL (ref 8.6–10.2)
Chloride: 102 mmol/L (ref 96–106)
Creatinine, Ser: 1.03 mg/dL (ref 0.76–1.27)
GFR, EST AFRICAN AMERICAN: 86 mL/min/{1.73_m2} (ref >59)
GFR, EST NON AFRICAN AMERICAN: 75 mL/min/{1.73_m2} (ref >59)
GLOBULIN, TOTAL: 2.4 g/dL (ref 1.5–4.5)
GLUCOSE: 104 mg/dL — AB (ref 65–99)
Potassium: 4.5 mmol/L (ref 3.5–5.2)
Sodium: 140 mmol/L (ref 134–144)
Total Protein: 7 g/dL (ref 6.0–8.5)

## 2017-02-27 LAB — LIPID PANEL
CHOLESTEROL TOTAL: 133 mg/dL (ref 100–199)
Chol/HDL Ratio: 2.7 ratio (ref 0.0–5.0)
HDL: 49 mg/dL (ref >39)
LDL CALC: 67 mg/dL (ref 0–99)
Triglycerides: 87 mg/dL (ref 0–149)
VLDL CHOLESTEROL CAL: 17 mg/dL (ref 5–40)

## 2017-02-27 LAB — CBC WITH DIFFERENTIAL/PLATELET
BASOS ABS: 0 10*3/uL (ref 0.0–0.2)
Basos: 0 %
EOS (ABSOLUTE): 0.2 10*3/uL (ref 0.0–0.4)
Eos: 3 %
HEMATOCRIT: 50.2 % (ref 37.5–51.0)
Hemoglobin: 16.5 g/dL (ref 13.0–17.7)
Immature Grans (Abs): 0 10*3/uL (ref 0.0–0.1)
Immature Granulocytes: 0 %
LYMPHS ABS: 2.6 10*3/uL (ref 0.7–3.1)
Lymphs: 33 %
MCH: 30.8 pg (ref 26.6–33.0)
MCHC: 32.9 g/dL (ref 31.5–35.7)
MCV: 94 fL (ref 79–97)
MONOCYTES: 7 %
MONOS ABS: 0.6 10*3/uL (ref 0.1–0.9)
NEUTROS ABS: 4.6 10*3/uL (ref 1.4–7.0)
Neutrophils: 57 %
Platelets: 198 10*3/uL (ref 150–379)
RBC: 5.36 x10E6/uL (ref 4.14–5.80)
RDW: 13.9 % (ref 12.3–15.4)
WBC: 8 10*3/uL (ref 3.4–10.8)

## 2017-02-27 LAB — TSH: TSH: 2.4 u[IU]/mL (ref 0.450–4.500)

## 2017-02-27 LAB — HEMOGLOBIN A1C
Est. average glucose Bld gHb Est-mCnc: 114 mg/dL
Hgb A1c MFr Bld: 5.6 % (ref 4.8–5.6)

## 2017-03-13 ENCOUNTER — Ambulatory Visit (INDEPENDENT_AMBULATORY_CARE_PROVIDER_SITE_OTHER): Payer: Medicare Other

## 2017-03-13 ENCOUNTER — Encounter: Payer: Self-pay | Admitting: *Deleted

## 2017-03-13 VITALS — BP 130/72 | HR 68 | Temp 97.6°F | Ht 72.0 in | Wt 178.8 lb

## 2017-03-13 DIAGNOSIS — Z1211 Encounter for screening for malignant neoplasm of colon: Secondary | ICD-10-CM | POA: Diagnosis not present

## 2017-03-13 DIAGNOSIS — Z0000 Encounter for general adult medical examination without abnormal findings: Secondary | ICD-10-CM

## 2017-03-13 NOTE — Patient Instructions (Signed)
Jack Lutz , Thank you for taking time to come for your Medicare Wellness Visit. I appreciate your ongoing commitment to your health goals. Please review the following plan we discussed and let me know if I can assist you in the future.   Screening recommendations/referrals: Colonoscopy: referral sent today Recommended yearly ophthalmology/optometry visit for glaucoma screening and checkup Recommended yearly dental visit for hygiene and checkup  Vaccinations: Influenza vaccine: due 06/2017 Pneumococcal vaccine: completed series Tdap vaccine: completed 10/30/10, due 10/2020 Shingles vaccine: completed 10/30/10  Advanced directives: Please bring a copy of your POA (Power of [place]) and/or Living Will to your next appointment.   Conditions/risks identified: Recommend increasing water intake to 6 glasses a day.  Next appointment: None, need to schedule 1 year AWV  Preventive Care 68 Years and Older, Male Preventive care refers to lifestyle choices and visits with your health care provider that can promote health and wellness. What does preventive care include?  A yearly physical exam. This is also called an annual well check.  Dental exams once or twice a year.  Routine eye exams. Ask your health care provider how often you should have your eyes checked.  Personal lifestyle choices, including:  Daily care of your teeth and gums.  Regular physical activity.  Eating a healthy diet.  Avoiding tobacco and drug use.  Limiting alcohol use.  Practicing safe sex.  Taking low doses of aspirin every day.  Taking vitamin and mineral supplements as recommended by your health care provider. What happens during an annual well check? The services and screenings done by your health care provider during your annual well check will depend on your age, overall health, lifestyle risk factors, and family history of disease. Counseling  Your health care provider may ask you questions about  your:  Alcohol use.  Tobacco use.  Drug use.  Emotional well-being.  Home and relationship well-being.  Sexual activity.  Eating habits.  History of falls.  Memory and ability to understand (cognition).  Work and work Statistician. Screening  You may have the following tests or measurements:  Height, weight, and BMI.  Blood pressure.  Lipid and cholesterol levels. These may be checked every 5 years, or more frequently if you are over 68 years old.  Skin check.  Lung cancer screening. You may have this screening every year starting at age 68 if you have a 30-pack-year history of smoking and currently smoke or have quit within the past 15 years.  Fecal occult blood test (FOBT) of the stool. You may have this test every year starting at age 68.  Flexible sigmoidoscopy or colonoscopy. You may have a sigmoidoscopy every 5 years or a colonoscopy every 10 years starting at age 68.  Prostate cancer screening. Recommendations will vary depending on your family history and other risks.  Hepatitis C blood test.  Hepatitis B blood test.  Sexually transmitted disease (STD) testing.  Diabetes screening. This is done by checking your blood sugar (glucose) after you have not eaten for a while (fasting). You may have this done every 1-3 years.  Abdominal aortic aneurysm (AAA) screening. You may need this if you are a current or former smoker.  Osteoporosis. You may be screened starting at age 68 if you are at high risk. if you are at high risk. Talk with your health care provider about your test results, treatment options, and if necessary, the need for more tests. Vaccines  Your health care provider may recommend certain vaccines, such as:  Influenza vaccine. This is recommended every  year.  Tetanus, diphtheria, and acellular pertussis (Tdap, Td) vaccine. You may need a Td booster every 10 years.  Zoster vaccine. You may need this after age 68.  Pneumococcal 13-valent conjugate (PCV13) vaccine.  One dose is recommended after age 68.  Pneumococcal polysaccharide (PPSV23) vaccine. One dose is recommended after age 68. Talk to your health care provider about which screenings and vaccines you need and how often you need them. This information is not intended to replace advice given to you by your health care provider. Make sure you discuss any questions you have with your health care provider. Document Released: 10/21/2015 Document Revised: 06/13/2016 Document Reviewed: 07/26/2015 Elsevier Interactive Patient Education  2017 [place] Prevention in the Home Falls can cause injuries. They can happen to people of all ages. There are many things you can do to make your home safe and to help prevent falls. What can I do on the outside of my home?  Regularly fix the edges of walkways and driveways and fix any cracks.  Remove anything that might make you trip as you walk through a door, such as a raised step or threshold.  Trim any bushes or trees on the path to your home.  Use bright outdoor lighting.  Clear any walking paths of anything that might make someone trip, such as rocks or tools.  Regularly check to see if handrails are loose or broken. Make sure that both sides of any steps have handrails.  Any raised decks and porches should have guardrails on the edges.  Have any leaves, snow, or ice cleared regularly.  Use sand or salt on walking paths during winter.  Clean up any spills in your garage right away. This includes oil or grease spills. What can I do in the bathroom?  Use night lights.  Install grab bars by the toilet and in the tub and shower. Do not use towel bars as grab bars.  Use non-skid mats or decals in the tub or shower.  If you need to sit down in the shower, use a plastic, non-slip stool.  Keep the floor dry. Clean up any water that spills on the floor as soon as it happens.  Remove soap buildup in the tub or shower regularly.  Attach bath  mats securely with double-sided non-slip rug tape.  Do not have throw rugs and other things on the floor that can make you trip. What can I do in the bedroom?  Use night lights.  Make sure that you have a light by your bed that is easy to reach.  Do not use any sheets or blankets that are too big for your bed. They should not hang down onto the floor.  Have a firm chair that has side arms. You can use this for support while you get dressed.  Do not have throw rugs and other things on the floor that can make you trip. What can I do in the kitchen?  Clean up any spills right away.  Avoid walking on wet floors.  Keep items that you use a lot in easy-to-reach places.  If you need to reach something above you, use a strong step stool that has a grab bar.  Keep electrical cords out of the way.  Do not use floor polish or wax that makes floors slippery. If you must use wax, use non-skid floor wax.  Do not have throw rugs and other things on the floor that can make you trip. What can  I do with my stairs?  Do not leave any items on the stairs.  Make sure that there are handrails on both sides of the stairs and use them. Fix handrails that are broken or loose. Make sure that handrails are as long as the stairways.  Check any carpeting to make sure that it is firmly attached to the stairs. Fix any carpet that is loose or worn.  Avoid having throw rugs at the top or bottom of the stairs. If you do have throw rugs, attach them to the floor with carpet tape.  Make sure that you have a light switch at the top of the stairs and the bottom of the stairs. If you do not have them, ask someone to add them for you. What else can I do to help prevent falls?  Wear shoes that:  Do not have high heels.  Have rubber bottoms.  Are comfortable and fit you well.  Are closed at the toe. Do not wear sandals.  If you use a stepladder:  Make sure that it is fully opened. Do not climb a closed  stepladder.  Make sure that both sides of the stepladder are locked into place.  Ask someone to hold it for you, if possible.  Clearly mark and make sure that you can see:  Any grab bars or handrails.  First and last steps.  Where the edge of each step is.  Use tools that help you move around (mobility aids) if they are needed. These include:  Canes.  Walkers.  Scooters.  Crutches.  Turn on the lights when you go into a dark area. Replace any light bulbs as soon as they burn out.  Set up your furniture so you have a clear path. Avoid moving your furniture around.  If any of your floors are uneven, fix them.  If there are any pets around you, be aware of where they are.  Review your medicines with your doctor. Some medicines can make you feel dizzy. This can increase your chance of falling. Ask your doctor what other things that you can do to help prevent falls. This information is not intended to replace advice given to you by your health care provider. Make sure you discuss any questions you have with your health care provider. Document Released: 07/21/2009 Document Revised: 03/01/2016 Document Reviewed: 10/29/2014 Elsevier Interactive Patient Education  2017 [employer].

## 2017-03-13 NOTE — Progress Notes (Signed)
Subjective:   Jack Lutz is a 66 y.o. male who presents for Medicare Annual/Subsequent preventive examination.  Review of Systems:  N/A  Cardiac Risk Factors include: advanced age (>39men, >55 women);dyslipidemia;hypertension;male gender     Objective:    Vitals: BP 130/72 (BP Location: Right Arm)   Pulse 68   Temp 97.6 F (36.4 C) (Oral)   Ht 6' (1.829 m)   Wt 178 lb 12.8 oz (81.1 kg)   BMI 24.25 kg/m   Body mass index is 24.25 kg/m.  Tobacco History  Smoking Status  . Never Smoker  Smokeless Tobacco  . Never Used     Counseling given: Not Answered   History reviewed. No pertinent past medical history. Past Surgical History:  Procedure Laterality Date  . CHOLECYSTECTOMY     Family History  Problem Relation Age of Onset  . Heart attack Father   . Heart attack Brother   . Other Brother        brain tumor  . Heart attack Brother   . Heart disease Mother   . Emphysema Mother   . Heart attack Sister   . Heart disease Sister    History  Sexual Activity  . Sexual activity: Not on file    Outpatient Encounter Prescriptions as of 03/13/2017  Medication Sig  . aspirin 81 MG tablet Take by mouth.  Marland Kitchen atorvastatin (LIPITOR) 40 MG tablet take 1 tablet by mouth once daily  . CALCIUM POLYCARBOPHIL PO Take by mouth.  . Cholecalciferol (VITAMIN D) 2000 UNITS tablet Take by mouth.  . Coenzyme Q10 (CO Q-10) 100 MG CAPS Take 1 capsule by mouth daily.  . fluticasone (FLONASE) 50 MCG/ACT nasal spray instill 2 sprays into each nostril once daily  . HYDROcodone-acetaminophen (NORCO/VICODIN) 5-325 MG tablet Take 1 tablet by mouth every 4 (four) hours as needed for moderate pain.  Marland Kitchen lisinopril (PRINIVIL,ZESTRIL) 40 MG tablet take 1 tablet by mouth once daily  . loratadine (CLARITIN) 10 MG tablet Take by mouth.  . Multiple Vitamin tablet Take by mouth.  . naproxen (NAPROSYN) 500 MG tablet Take 1 tablet (500 mg total) by mouth 2 (two) times daily with a meal.   No  facility-administered encounter medications on file as of 03/13/2017.     Activities of Daily Living In your present state of health, do you have any difficulty performing the following activities: 03/13/2017 02/26/2017  Hearing? N N  Vision? N N  Difficulty concentrating or making decisions? N N  Walking or climbing stairs? N N  Dressing or bathing? N N  Doing errands, shopping? N N  Preparing Food and eating ? N -  Using the Toilet? N -  In the past six months, have you accidently leaked urine? N -  Do you have problems with loss of bowel control? N -  Managing your Medications? N -  Managing your Finances? N -  Housekeeping or managing your Housekeeping? N -  Some recent data might be hidden    Patient Care Team: Jerrol Banana., MD as PCP - General (Family Medicine) Thelma Comp, OD as Consulting Physician (Optometry)   Assessment:     Exercise Activities and Dietary recommendations Current Exercise Habits: Home exercise routine, Type of exercise: walking, Time (Minutes): 35, Frequency (Times/Week): 6, Weekly Exercise (Minutes/Week): 210, Intensity: Mild, Exercise limited by: None identified  Goals    . Increase water intake          Recommend increasing water intake to 6 glasses  a day.      Fall Risk Fall Risk  03/13/2017 02/26/2017 02/21/2016  Falls in the past year? No No No   Depression Screen PHQ 2/9 Scores 03/13/2017 03/13/2017 02/26/2017 02/21/2016  PHQ - 2 Score 0 0 0 0  PHQ- 9 Score 0 - - -    Cognitive Function     6CIT Screen 03/13/2017  What Year? 0 points  What month? 0 points  What time? 0 points  Count back from 20 0 points  Months in reverse 0 points  Repeat phrase 0 points  Total Score 0    Immunization History  Administered Date(s) Administered  . Pneumococcal Conjugate-13 01/18/2015  . Pneumococcal Polysaccharide-23 02/21/2016  . Tdap 10/30/2010  . Zoster 10/30/2010   Screening Tests Health Maintenance  Topic Date Due  .  COLONOSCOPY  09/12/2017 (Originally 09/13/1999)  . INFLUENZA VACCINE  05/08/2017  . TETANUS/TDAP  10/30/2020  . Hepatitis C Screening  Completed  . PNA vac Low Risk Adult  Completed      Plan:  I have personally reviewed and addressed the Medicare Annual Wellness questionnaire and have noted the following in the patient's chart:  A. Medical and social history B. Use of alcohol, tobacco or illicit drugs  C. Current medications and supplements D. Functional ability and status E.  Nutritional status F.  Physical activity G. Advance directives H. List of other physicians I.  Hospitalizations, surgeries, and ER visits in previous 12 months J.  [place] such as hearing and vision if needed, cognitive and depression L. Referrals and appointments - none  In addition, I have reviewed and discussed with patient certain preventive protocols, quality metrics, and best practice recommendations. A written personalized care plan for preventive services as well as general preventive health recommendations were provided to patient.  See attached scanned questionnaire for additional information.   Signed,  Fabio Neighbors, LPN Nurse Health Advisor   MD Recommendations: None. Colonoscopy referral sent today.

## 2017-03-18 ENCOUNTER — Ambulatory Visit (INDEPENDENT_AMBULATORY_CARE_PROVIDER_SITE_OTHER): Payer: Medicare Other | Admitting: General Surgery

## 2017-03-18 ENCOUNTER — Encounter: Payer: Self-pay | Admitting: General Surgery

## 2017-03-18 VITALS — BP 130/74 | HR 70 | Resp 12 | Ht 72.0 in | Wt 177.0 lb

## 2017-03-18 DIAGNOSIS — Z1211 Encounter for screening for malignant neoplasm of colon: Secondary | ICD-10-CM

## 2017-03-18 MED ORDER — POLYETHYLENE GLYCOL 3350 17 GM/SCOOP PO POWD: ORAL | 0 refills | Status: DC

## 2017-03-18 NOTE — Patient Instructions (Signed)
Colonoscopy, Adult  A colonoscopy is an exam to look at the large intestine. It is done to check for problems, such as:  · Lumps (tumors).  · Growths (polyps).  · Swelling (inflammation).  · Bleeding.    What happens before the procedure?  Eating and drinking   Follow instructions from your doctor about eating and drinking. These instructions may include:  · A few days before the procedure - follow a low-fiber diet.  ? Avoid nuts.  ? Avoid seeds.  ? Avoid dried fruit.  ? Avoid raw fruits.  ? Avoid vegetables.  · 1-3 days before the procedure - follow a clear liquid diet. Avoid liquids that have red or purple dye. Drink only clear liquids, such as:  ? Clear broth or bouillon.  ? Black coffee or tea.  ? Clear juice.  ? Clear soft drinks or sports drinks.  ? Gelatin dessert.  ? Popsicles.  · On the day of the procedure - do not eat or drink anything during the 2 hours before the procedure.    Bowel prep   If you were prescribed an oral bowel prep:  · Take it as told by your doctor. Starting the day before your procedure, you will need to drink a lot of liquid. The liquid will cause you to poop (have bowel movements) until your poop is almost clear or light green.  · If your skin or butt gets irritated from diarrhea, you may:  ? Wipe the area with wipes that have medicine in them, such as adult wet wipes with aloe and vitamin E.  ? Put something on your skin that soothes the area, such as petroleum jelly.  · If you throw up (vomit) while drinking the bowel prep, take a break for up to 60 minutes. Then begin the bowel prep again. If you keep throwing up and you cannot take the bowel prep without throwing up, call your doctor.    General instructions   · Ask your doctor about changing or stopping your normal medicines. This is important if you take diabetes medicines or blood thinners.  · Plan to have someone take you home from the hospital or clinic.  What happens during the procedure?  · An IV tube may be put into one  of your veins.  · You will be given medicine to help you relax (sedative).  · To reduce your risk of infection:  ? Your doctors will wash their hands.  ? Your anal area will be washed with soap.  · You will be asked to lie on your side with your knees bent.  · Your doctor will get a long, thin, flexible tube ready. The tube will have a camera and a light on the end.  · The tube will be put into your anus.  · The tube will be gently put into your large intestine.  · Air will be delivered into your large intestine to keep it open. You may feel some pressure or cramping.  · The camera will be used to take photos.  · A small tissue sample may be removed from your body to be looked at under a microscope (biopsy). If any possible problems are found, the tissue will be sent to a lab for testing.  · If small growths are found, your doctor may remove them and have them checked for cancer.  · The tube that was put into your anus will be slowly removed.  The procedure may vary among doctors   and hospitals.  What happens after the procedure?  · Your doctor will check on you often until the medicines you were given have worn off.  · Do not drive for 24 hours after the procedure.  · You may have a small amount of blood in your poop.  · You may pass gas.  · You may have mild cramps or bloating in your belly (abdomen).  · It is up to you to get the results of your procedure. Ask your doctor, or the department performing the procedure, when your results will be ready.  This information is not intended to replace advice given to you by your health care provider. Make sure you discuss any questions you have with your health care provider.  Document Released: 10/27/2010 Document Revised: 07/25/2016 Document Reviewed: 12/06/2015  Elsevier Interactive Patient Education © 2017 [employer]

## 2017-03-18 NOTE — Progress Notes (Signed)
Patient ID: Jack Lutz, male   DOB: October 14, 1950, 66 y.o.   MRN: [id number]  Chief Complaint  Patient presents with  . Colonoscopy    HPI Jack Lutz is a 66 y.o. male here for a discussion of a colonoscopy. His last one was on 08/19/06. He moves his bowels daily and denies any GI issues.  HPI  Past Medical History:  Diagnosis Date  . Allergy   . Hyperlipidemia   . Hypertension     Past Surgical History:  Procedure Laterality Date  . CHOLECYSTECTOMY  07/05/1996    Family History  Problem Relation Age of Onset  . Heart attack Father   . Heart attack Brother   . Other Brother        brain tumor  . Heart attack Brother   . Heart disease Mother   . Emphysema Mother   . Heart attack Sister   . Heart disease Sister     Social History Social History  Substance Use Topics  . Smoking status: Never Smoker  . Smokeless tobacco: Never Used  . Alcohol use 0.6 - 1.2 oz/week    1 - 2 Cans of beer per week    No Known Allergies  Current Outpatient Prescriptions  Medication Sig Dispense Refill  . aspirin 81 MG tablet Take by mouth.    Marland Kitchen atorvastatin (LIPITOR) 40 MG tablet take 1 tablet by mouth once daily 90 tablet 3  . Cholecalciferol (VITAMIN D) 2000 UNITS tablet Take by mouth.    Marland Kitchen CINNAMON PO Take 1,000 mg by mouth.    . Coenzyme Q10 (CO Q-10) 100 MG CAPS Take 1 capsule by mouth daily.    . fluticasone (FLONASE) 50 MCG/ACT nasal spray instill 2 sprays into each nostril once daily 16 g 12  . lisinopril (PRINIVIL,ZESTRIL) 40 MG tablet take 1 tablet by mouth once daily 30 tablet 12  . loratadine (CLARITIN) 10 MG tablet Take by mouth.    . vitamin C (ASCORBIC ACID) 500 MG tablet Take 500 mg by mouth daily.    . polyethylene glycol powder (GLYCOLAX/MIRALAX) powder 255 grams one bottle for colonoscopy prep 255 g 0   No current facility-administered medications for this visit.     Review of Systems Review of Systems  Constitutional: Negative.   Respiratory: Negative.    Cardiovascular: Negative.   Gastrointestinal: Negative.     Blood pressure 130/74, pulse 70, resp. rate 12, height 6' (1.829 m), weight 177 lb (80.3 kg).  Physical Exam Physical Exam  Constitutional: He is oriented to person, place, and time. He appears well-developed and well-nourished.  Eyes: Conjunctivae are normal. No scleral icterus.  Neck: Neck supple.  Cardiovascular: Normal rate, regular rhythm and normal heart sounds.   Pulmonary/Chest: Effort normal and breath sounds normal.  Abdominal: Soft. Bowel sounds are normal.  Lymphadenopathy:    He has no cervical adenopathy.  Neurological: He is alert and oriented to person, place, and time.  Skin: Skin is warm and dry.  Psychiatric: He has a normal mood and affect.    Data Reviewed 08/19/2006 screening colonoscopy showed multiple small mouth diverticula in the sigmoid colon. Petechial area in the sigmoid noted as well. No polyps.  Comprehensive metabolic panel and CBC dated 02/26/2017 reviewed. Hemoglobin 16.5, platelet count 198,000, Creatinine 1.0 with an estimated GFR of 75.  Assessment    Candidate for screening colonoscopy.    Plan    Colonoscopy with possible biopsy/polypectomy prn: Information regarding the procedure, including its potential risks  and complications (including but not limited to perforation of the bowel, which may require emergency surgery to repair, and bleeding) was verbally given to the patient. Educational information regarding lower intestinal endoscopy was given to the patient. Written instructions for how to complete the bowel prep using Miralax were provided. The importance of drinking ample fluids to avoid dehydration as a result of the prep emphasized.    HPI, Physical Exam, Assessment and Plan have been scribed under the direction and in the presence of Robert Bellow, MD  Concepcion Living, LPN  I have completed the exam and reviewed the above documentation for accuracy and  completeness.  I agree with the above.  Haematologist has been used and any errors in dictation or transcription are unintentional.  Hervey Ard, M.D., F.A.C.S.  Robert Bellow 03/18/2017, 12:58 PM   Patient has been scheduled for a colonoscopy on 05-15-17 at [facility]. It is okay for patient to continue 81 mg aspirin once daily. Miralax prescription has been sent in to the patient's pharmacy today. Colonoscopy instructions have been reviewed with the patient. This patient is aware to call the office if they have further questions.   Dominga Ferry, CMA

## 2017-05-08 ENCOUNTER — Encounter: Payer: Self-pay | Admitting: *Deleted

## 2017-05-11 ENCOUNTER — Other Ambulatory Visit: Payer: Self-pay | Admitting: Family Medicine

## 2017-05-13 ENCOUNTER — Telehealth: Payer: Self-pay | Admitting: *Deleted

## 2017-05-13 NOTE — Telephone Encounter (Signed)
Message left for patient to call the office or respond to My Chart message that was sent last week.   Patient is scheduled for a colonoscopy on 05-15-17 with Dr. Bary Castilla.   We need to confirm no change in medications or health since his last office visit.   Also, need to make sure patient has Miralax prescription.

## 2017-05-13 NOTE — Telephone Encounter (Signed)
Patient states no changes since last seen. He is aware of instructions and has no questions.

## 2017-05-15 ENCOUNTER — Ambulatory Visit: Payer: Medicare Other | Admitting: Anesthesiology

## 2017-05-15 ENCOUNTER — Encounter
Admission: RE | Disposition: A | Discharge status: Home / Self Care | Payer: Self-pay | Source: Ambulatory Visit | Attending: General Surgery

## 2017-05-15 ENCOUNTER — Ambulatory Visit
Admission: RE | Admit: 2017-05-15 | Discharge: 2017-05-15 | Disposition: A | Discharge status: Home / Self Care | Payer: Medicare Other | Source: Ambulatory Visit | Attending: General Surgery | Admitting: General Surgery

## 2017-05-15 ENCOUNTER — Encounter: Payer: Self-pay | Admitting: *Deleted

## 2017-05-15 DIAGNOSIS — K5730 Diverticulosis of large intestine without perforation or abscess without bleeding: Secondary | ICD-10-CM | POA: Insufficient documentation

## 2017-05-15 DIAGNOSIS — Z1211 Encounter for screening for malignant neoplasm of colon: Secondary | ICD-10-CM | POA: Diagnosis not present

## 2017-05-15 DIAGNOSIS — K635 Polyp of colon: Secondary | ICD-10-CM | POA: Diagnosis not present

## 2017-05-15 DIAGNOSIS — Z7982 Long term (current) use of aspirin: Secondary | ICD-10-CM | POA: Insufficient documentation

## 2017-05-15 DIAGNOSIS — K621 Rectal polyp: Secondary | ICD-10-CM | POA: Diagnosis not present

## 2017-05-15 DIAGNOSIS — Z79899 Other long term (current) drug therapy: Secondary | ICD-10-CM | POA: Insufficient documentation

## 2017-05-15 DIAGNOSIS — I10 Essential (primary) hypertension: Secondary | ICD-10-CM | POA: Diagnosis not present

## 2017-05-15 DIAGNOSIS — E785 Hyperlipidemia, unspecified: Secondary | ICD-10-CM | POA: Diagnosis not present

## 2017-05-15 DIAGNOSIS — K5790 Diverticulosis of intestine, part unspecified, without perforation or abscess without bleeding: Secondary | ICD-10-CM | POA: Diagnosis not present

## 2017-05-15 HISTORY — PX: COLONOSCOPY WITH PROPOFOL: SHX5780

## 2017-05-15 SURGERY — COLONOSCOPY WITH PROPOFOL
Anesthesia: General

## 2017-05-15 MED ORDER — PHENYLEPHRINE HCL 10 MG/ML IJ SOLN
INTRAMUSCULAR | Status: DC | PRN
  Administered 2017-05-15: 100 ug via INTRAVENOUS

## 2017-05-15 MED ORDER — PROPOFOL 10 MG/ML IV BOLUS
INTRAVENOUS | Status: DC | PRN
  Administered 2017-05-15: 30 mg via INTRAVENOUS

## 2017-05-15 MED ORDER — MIDAZOLAM HCL 2 MG/2ML IJ SOLN
INTRAMUSCULAR | Status: AC
  Filled 2017-05-15: qty 2

## 2017-05-15 MED ORDER — PROPOFOL 500 MG/50ML IV EMUL
INTRAVENOUS | Status: AC
  Filled 2017-05-15: qty 50

## 2017-05-15 MED ORDER — SODIUM CHLORIDE 0.9 % IV SOLN
INTRAVENOUS | Status: DC
  Administered 2017-05-15: 10:00:00 via INTRAVENOUS

## 2017-05-15 MED ORDER — PHENYLEPHRINE HCL 10 MG/ML IJ SOLN
INTRAMUSCULAR | Status: AC
  Filled 2017-05-15: qty 1

## 2017-05-15 MED ORDER — PROPOFOL 500 MG/50ML IV EMUL
INTRAVENOUS | Status: DC | PRN
  Administered 2017-05-15: 120 ug/kg/min via INTRAVENOUS

## 2017-05-15 MED ORDER — MIDAZOLAM HCL 2 MG/2ML IJ SOLN
INTRAMUSCULAR | Status: DC | PRN
  Administered 2017-05-15 (×2): 1 mg via INTRAVENOUS

## 2017-05-15 NOTE — H&P (Signed)
SALMAAN PATCHIN [id number] 1950/12/17     HPI: Healthy 66 y/o male for screening colonoscopy. Tolerated prep well.   Prescriptions Prior to Admission  Medication Sig Dispense Refill Last Dose  . aspirin 81 MG tablet Take by mouth.   05/14/2017 at Unknown time  . atorvastatin (LIPITOR) 40 MG tablet take 1 tablet by mouth once daily 90 tablet 3 05/14/2017 at Unknown time  . Cholecalciferol (VITAMIN D) 2000 UNITS tablet Take by mouth.   Past Week at Unknown time  . CINNAMON PO Take 1,000 mg by mouth.   Past Week at Unknown time  . Coenzyme Q10 (CO Q-10) 100 MG CAPS Take 1 capsule by mouth daily.   05/14/2017 at Unknown time  . fluticasone (FLONASE) 50 MCG/ACT nasal spray instill 2 sprays into each nostril once daily 16 g 12 05/14/2017 at Unknown time  . lisinopril (PRINIVIL,ZESTRIL) 40 MG tablet take 1 tablet by mouth once daily 30 tablet 12 05/14/2017 at Unknown time  . loratadine (CLARITIN) 10 MG tablet Take by mouth.   05/14/2017 at Unknown time  . vitamin C (ASCORBIC ACID) 500 MG tablet Take 500 mg by mouth daily.   05/14/2017 at Unknown time  . polyethylene glycol powder (GLYCOLAX/MIRALAX) powder 255 grams one bottle for colonoscopy prep 255 g 0    No Known Allergies Past Medical History:  Diagnosis Date  . Allergy   . Hyperlipidemia   . Hypertension    Past Surgical History:  Procedure Laterality Date  . CHOLECYSTECTOMY  07/05/1996   Social History   Social History  . Marital status: Married    Spouse name: N/A  . Number of children: N/A  . Years of education: N/A   Occupational History  . Not on file.   Social History Main Topics  . Smoking status: Never Smoker  . Smokeless tobacco: Never Used  . Alcohol use 0.6 - 1.2 oz/week    1 - 2 Cans of beer per week  . Drug use: No  . Sexual activity: Not on file   Other Topics Concern  . Not on file   Social History Narrative  . No narrative on file   Social History   Social History Narrative  . No narrative on file     ROS:  Negative.     PE: HEENT: Negative. Lungs: Clear. Cardio: RR.   Assessment/Plan:  Proceed with planned endoscopy.  Robert Bellow 05/15/2017

## 2017-05-15 NOTE — Transfer of Care (Signed)
Immediate Anesthesia Transfer of Care Note  Patient: Jack Lutz  Procedure(s) Performed: Procedure(s): COLONOSCOPY WITH PROPOFOL (N/A)  Patient Location: PACU  Anesthesia Type:General  Level of Consciousness: awake  Airway & Oxygen Therapy: Patient Spontanous Breathing and Patient connected to nasal cannula oxygen  Post-op Assessment: Report given to RN and Post -op Vital signs reviewed and stable  Post vital signs: Reviewed  Last Vitals:  Vitals:   05/15/17 0935  BP: 129/79  Pulse: 60  Resp: 18  Temp: 36.4 C    Last Pain:  Vitals:   05/15/17 0935  TempSrc: Tympanic         Complications: No apparent anesthesia complications

## 2017-05-15 NOTE — Anesthesia Postprocedure Evaluation (Signed)
Anesthesia Post Note  Patient: Jack Lutz  Procedure(s) Performed: Procedure(s) (LRB): COLONOSCOPY WITH PROPOFOL (N/A)  Patient location during evaluation: PACU Anesthesia Type: General Level of consciousness: awake Pain management: pain level controlled Vital Signs Assessment: post-procedure vital signs reviewed and stable Respiratory status: spontaneous breathing Cardiovascular status: stable Anesthetic complications: no     Last Vitals:  Vitals:   05/15/17 1100 05/15/17 1110  BP: 118/83 101/78  Pulse:    Resp:    Temp:      Last Pain:  Vitals:   05/15/17 1040  TempSrc: Tympanic                 VAN STAVEREN,Jaikob Borgwardt

## 2017-05-15 NOTE — Anesthesia Preprocedure Evaluation (Signed)
Anesthesia Evaluation  Patient identified by MRN, date of birth, ID band Patient awake    Reviewed: Allergy & Precautions, NPO status , Patient's Chart, lab work & pertinent test results  Airway Mallampati: II       Dental  (+) Teeth Intact   Pulmonary neg pulmonary ROS,    breath sounds clear to auscultation       Cardiovascular Exercise Tolerance: Good hypertension, Pt. on medications  Rhythm:Regular     Neuro/Psych negative neurological ROS     GI/Hepatic negative GI ROS, Neg liver ROS,   Endo/Other  negative endocrine ROS  Renal/GU negative Renal ROS     Musculoskeletal negative musculoskeletal ROS (+)   Abdominal   Peds negative pediatric ROS (+)  Hematology negative hematology ROS (+)   Anesthesia Other Findings   Reproductive/Obstetrics                             Anesthesia Physical Anesthesia Plan  ASA: II  Anesthesia Plan: General   Post-op Pain Management:    Induction: Intravenous  PONV Risk Score and Plan:   Airway Management Planned: Natural Airway and Nasal Cannula  Additional Equipment:   Intra-op Plan:   Post-operative Plan:   Informed Consent: I have reviewed the patients History and Physical, chart, labs and discussed the procedure including the risks, benefits and alternatives for the proposed anesthesia with the patient or authorized representative who has indicated his/her understanding and acceptance.     Plan Discussed with: Surgeon  Anesthesia Plan Comments:         Anesthesia Quick Evaluation

## 2017-05-15 NOTE — Anesthesia Post-op Follow-up Note (Signed)
Anesthesia QCDR form completed.        

## 2017-05-15 NOTE — Op Note (Signed)
[facility] Gastroenterology Patient Name: Jack Lutz Procedure Date: 05/15/2017 10:08 AM MRN: [id number] Account #: [id number] Date of Birth: April 07, 1951 Admit Type: Outpatient Age: 66 Room: [facility] ENDO ROOM 1 Gender: Male Note Status: Finalized Procedure:            Colonoscopy Indications:          Screening for colorectal malignant neoplasm Providers:            Robert Bellow, MD Referring MD:         Janine Ores. Rosanna Randy, MD (Referring MD) Medicines:            Monitored Anesthesia Care Complications:        No immediate complications. Procedure:            Pre-Anesthesia Assessment:                       - Prior to the procedure, a History and Physical was                        performed, and patient medications, allergies and                        sensitivities were reviewed. The patient's tolerance of                        previous anesthesia was reviewed.                       - The risks and benefits of the procedure and the                        sedation options and risks were discussed with the                        patient. All questions were answered and informed                        consent was obtained.                       After obtaining informed consent, the colonoscope was                        passed under direct vision. Throughout the procedure,                        the patient's blood pressure, pulse, and oxygen                        saturations were monitored continuously. The                        Colonoscope was introduced through the anus and                        advanced to the the cecum, identified by appendiceal                        orifice and ileocecal valve. The colonoscopy was  performed without difficulty. The patient tolerated the                        procedure well. The quality of the bowel preparation                        was excellent. Findings:      Multiple medium-mouthed  diverticula were found in the sigmoid colon.      A 5 mm polyp was found in the rectum (benign-appearing lesion). The       polyp was sessile. Biopsies were taken with a cold forceps for histology.      The retroflexed view of the distal rectum and anal verge was normal and       showed no anal or rectal abnormalities. Impression:           - Diverticulosis in the sigmoid colon.                       - One benign appearing 5 mm polyp in the rectum.                        Biopsied.                       - The distal rectum and anal verge are normal on                        retroflexion view. Recommendation:       - Telephone endoscopist for pathology results in 1 week. Procedure Code(s):    --- Professional ---                       [phone], Colonoscopy, flexible; with biopsy, single or                        multiple Diagnosis Code(s):    --- Professional ---                       Z12.11, Encounter for screening for malignant neoplasm                        of colon                       K62.1, Rectal polyp                       K57.30, Diverticulosis of large intestine without                        perforation or abscess without bleeding CPT copyright 2016 American Medical Association. All rights reserved. The codes documented in this report are preliminary and upon coder review may  be revised to meet current compliance requirements. Robert Bellow, MD 05/15/2017 10:43:30 AM This report has been signed electronically. Number of Addenda: 0 Note Initiated On: 05/15/2017 10:08 AM Scope Withdrawal Time: 0 hours 8 minutes 18 seconds  Total Procedure Duration: 0 hours 18 minutes 58 seconds       [facility]

## 2017-05-16 ENCOUNTER — Encounter: Payer: Self-pay | Admitting: General Surgery

## 2017-05-17 LAB — SURGICAL PATHOLOGY

## 2017-05-20 ENCOUNTER — Telehealth: Payer: Self-pay | Admitting: *Deleted

## 2017-05-20 NOTE — Telephone Encounter (Signed)
-----   Message from Robert Bellow, MD sent at 05/18/2017  6:37 AM EDT ----- Please notify the patient that the polyp removed at the time of his colonoscopy was entirely benign. He should plan on a repeat exam in 10 years. ----- Message ----- From: Interface, Lab In Three Zero One Sent: 05/17/2017  10:44 AM To: Robert Bellow, MD

## 2017-05-20 NOTE — Telephone Encounter (Signed)
Patient called looking for results

## 2017-05-20 NOTE — Telephone Encounter (Signed)
Notified patient as instructed, patient pleased. Discussed follow-up appointments, patient agrees. Patient placed in recalls.   

## 2017-05-24 ENCOUNTER — Encounter: Payer: Self-pay | Admitting: Family Medicine

## 2017-06-05 ENCOUNTER — Other Ambulatory Visit: Payer: Self-pay | Admitting: Family Medicine

## 2017-06-05 MED ORDER — LISINOPRIL 40 MG PO TABS: 40.0000 mg | ORAL_TABLET | Freq: Every day | ORAL | 3 refills | Status: DC

## 2017-06-05 NOTE — Telephone Encounter (Signed)
[facility] [facility] faxed a request for a 90-days supply for the following medication. Thanks CC  lisinopril (PRINIVIL,ZESTRIL) 40 MG tablet

## 2017-07-11 DIAGNOSIS — Z23 Encounter for immunization: Secondary | ICD-10-CM | POA: Diagnosis not present

## 2017-08-21 ENCOUNTER — Encounter: Payer: Self-pay | Admitting: General Surgery

## 2017-09-04 ENCOUNTER — Ambulatory Visit (INDEPENDENT_AMBULATORY_CARE_PROVIDER_SITE_OTHER): Payer: Medicare Other | Admitting: Family Medicine

## 2017-09-04 VITALS — BP 122/74 | HR 62 | Temp 97.7°F | Resp 14 | Wt 185.4 lb

## 2017-09-04 DIAGNOSIS — E7800 Pure hypercholesterolemia, unspecified: Secondary | ICD-10-CM

## 2017-09-04 DIAGNOSIS — I10 Essential (primary) hypertension: Secondary | ICD-10-CM | POA: Diagnosis not present

## 2017-09-04 NOTE — Progress Notes (Signed)
Jack Lutz  MRN: [id number] DOB: [birth year]  Subjective:  HPI  Patient is here for 6 months follow up. Last office visit was on 03/13/17 for AWE. HTN: patient is checking his b/p at the pharmacy and readings have been around high 120s/90s. No cardiac symptoms present. BP Readings from Last 3 Encounters:  09/04/17 122/74  05/15/17 101/78  03/18/17 130/74   Hyperlipidemia: patient is taking Atorvastatin daily 40 mg. Lab Results  Component Value Date   CHOL 133 02/26/2017   HDL 49 02/26/2017   LDLCALC 67 02/26/2017   TRIG 87 02/26/2017   CHOLHDL 2.7 02/26/2017  patient states he has no issues or concerns today.  Patient Active Problem List   Diagnosis Date Noted  . Encounter for screening colonoscopy 03/18/2017  . 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 Sebaceous cyst 05/03/2016  . 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 Essential (primary) hypertension 08/22/2015  . Hypercholesterolemia 08/22/2015  . Adiposity 08/22/2015  . Allergic rhinitis 08/22/2015  . 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 Avitaminosis D 08/22/2015    Past Medical History:  Diagnosis Date  . Allergy   . Hyperlipidemia   . Hypertension     Social History   Socioeconomic History  . Marital status: Married    Spouse name: Not on file  . Number of children: Not on file  . Years of education: Not on file  . Highest education level: Not on file  Social Needs  . Financial resource strain: Not on file  . Food insecurity - worry: Not on file  . Food insecurity - inability: Not on file  . Transportation needs - medical: Not on file  . Transportation needs - non-medical: Not on file  Occupational History  . Not on file  Tobacco Use  . Smoking status: Never Smoker  . Smokeless tobacco: Never Used  Substance and Sexual Activity  . Alcohol use: Yes    Alcohol/week: 0.6 - 1.2 oz    Types: 1 - 2 Cans of beer per week  . Drug use: No  . Sexual activity: Not on file  Other Topics Concern  . Not on file   Social History Narrative  . Not on file    Outpatient Encounter Medications as of 09/04/2017  Medication Sig Note  . aspirin 81 MG tablet Take by mouth. 08/22/2015: Received from: [facility]  . atorvastatin (LIPITOR) 40 MG tablet take 1 tablet by mouth once daily   . Cholecalciferol (VITAMIN D) 2000 UNITS tablet Take by mouth. 08/22/2015: Received from: [facility]  . CINNAMON PO Take 1,000 mg by mouth.   . Coenzyme Q10 (CO Q-10) 100 MG CAPS Take 1 capsule by mouth daily.   . fluticasone (FLONASE) 50 MCG/ACT nasal spray instill 2 sprays into each nostril once daily   . lisinopril (PRINIVIL,ZESTRIL) 40 MG tablet Take 1 tablet (40 mg total) by mouth daily.   Marland Kitchen loratadine (CLARITIN) 10 MG tablet Take by mouth. 08/22/2015: Received from: [facility]  . vitamin C (ASCORBIC ACID) 500 MG tablet Take 500 mg by mouth daily.   Marland Kitchen FLUAD 0.5 ML SUSY inject 0.5 milliliter intramuscularly    No facility-administered encounter medications on file as of 09/04/2017.     No Known Allergies  Review of Systems  Constitutional: Negative.   Eyes: Negative.   Respiratory: Negative.   Cardiovascular: Negative.   Gastrointestinal: Negative.   Musculoskeletal: Negative.   Skin: Negative.   Neurological: Negative.   Endo/Heme/Allergies: Negative.   Psychiatric/Behavioral:  Negative.     Objective:  BP 122/74   Pulse 62   Temp 97.7 F (36.5 C)   Resp 14   Wt 185 lb 6.4 oz (84.1 kg)   BMI 25.14 kg/m   Physical Exam  Constitutional: He is oriented to person, place, and time and well-developed, well-nourished, and in no distress.  HENT:  Head: Normocephalic and atraumatic.  Right Ear: External ear normal.  Left Ear: External ear normal.  Nose: Nose normal.  Mouth/Throat: Oropharynx is clear and moist.  Eyes: Conjunctivae are normal. No scleral icterus.  Neck: No thyromegaly present.  Cardiovascular: Normal rate, regular rhythm and normal  heart sounds.  Pulmonary/Chest: Effort normal and breath sounds normal.  Abdominal: Soft.  Neurological: He is alert and oriented to person, place, and time. Gait normal. GCS score is 15.  Skin: Skin is warm and dry.  Psychiatric: Memory, affect and judgment normal.    Assessment and Plan :  HTN HLD Stable.  I have done the exam and reviewed the chart and it is accurate to the best of my knowledge. Development worker, community has been used and  any errors in dictation or transcription are unintentional. Miguel Aschoff M.D. [facility]

## 2017-09-21 ENCOUNTER — Encounter: Payer: Self-pay | Admitting: Family Medicine

## 2018-02-20 ENCOUNTER — Ambulatory Visit: Payer: Self-pay

## 2018-03-05 ENCOUNTER — Encounter: Payer: Self-pay | Admitting: Family Medicine

## 2018-03-25 ENCOUNTER — Ambulatory Visit (INDEPENDENT_AMBULATORY_CARE_PROVIDER_SITE_OTHER): Payer: Medicare Other

## 2018-03-25 VITALS — BP 130/66 | HR 66 | Temp 98.6°F | Ht 72.0 in | Wt 185.8 lb

## 2018-03-25 DIAGNOSIS — Z0000 Encounter for general adult medical examination without abnormal findings: Secondary | ICD-10-CM

## 2018-03-25 NOTE — Patient Instructions (Addendum)
Jack Lutz , Thank you for taking time to come for your Medicare Wellness Visit. I appreciate your ongoing commitment to your health goals. Please review the following plan we discussed and let me know if I can assist you in the future.   Screening recommendations/referrals: Colonoscopy: Up to date Recommended yearly ophthalmology/optometry visit for glaucoma screening and checkup Recommended yearly dental visit for hygiene and checkup  Vaccinations: Influenza vaccine: Up to date Pneumococcal vaccine: Up to date Tdap vaccine: Up to date Shingles vaccine: Up to date    Advanced directives: Already on file.  Conditions/risks identified: Recommend increasing water intake to 4-6 glasses a day.   Next appointment: 03/26/18 @ 9:40 AM with Dr Rosanna Randy.   Preventive Care 69 Years and Older, Male Preventive care refers to lifestyle choices and visits with your health care provider that can promote health and wellness. What does preventive care include?  A yearly physical exam. This is also called an annual well check.  Dental exams once or twice a year.  Routine eye exams. Ask your health care provider how often you should have your eyes checked.  Personal lifestyle choices, including:  Daily care of your teeth and gums.  Regular physical activity.  Eating a healthy diet.  Avoiding tobacco and drug use.  Limiting alcohol use.  Practicing safe sex.  Taking low doses of aspirin every day.  Taking vitamin and mineral supplements as recommended by your health care provider. What happens during an annual well check? The services and screenings done by your health care provider during your annual well check will depend on your age, overall health, lifestyle risk factors, and family history of disease. Counseling  Your health care provider may ask you questions about your:  Alcohol use.  Tobacco use.  Drug use.  Emotional well-being.  Home and relationship  well-being.  Sexual activity.  Eating habits.  History of falls.  Memory and ability to understand (cognition).  Work and work Statistician. Screening  You may have the following tests or measurements:  Height, weight, and BMI.  Blood pressure.  Lipid and cholesterol levels. These may be checked every 5 years, or more frequently if you are over 64 years old.  Skin check.  Lung cancer screening. You may have this screening every year starting at age 14 if you have a 30-pack-year history of smoking and currently smoke or have quit within the past 15 years.  Fecal occult blood test (FOBT) of the stool. You may have this test every year starting at age 4.  Flexible sigmoidoscopy or colonoscopy. You may have a sigmoidoscopy every 5 years or a colonoscopy every 10 years starting at age 51.  Prostate cancer screening. Recommendations will vary depending on your family history and other risks.  Hepatitis C blood test.  Hepatitis B blood test.  Sexually transmitted disease (STD) testing.  Diabetes screening. This is done by checking your blood sugar (glucose) after you have not eaten for a while (fasting). You may have this done every 1-3 years.  Abdominal aortic aneurysm (AAA) screening. You may need this if you are a current or former smoker.  Osteoporosis. You may be screened starting at age 9 if you are at high risk. Talk with your health care provider about your test results, treatment options, and if necessary, the need for more tests. Vaccines  Your health care provider may recommend certain vaccines, such as:  Influenza vaccine. This is recommended every year.  Tetanus, diphtheria, and acellular pertussis (Tdap, Td)  vaccine. You may need a Td booster every 10 years.  Zoster vaccine. You may need this after age 39.  Pneumococcal 13-valent conjugate (PCV13) vaccine. One dose is recommended after age 24.  Pneumococcal polysaccharide (PPSV23) vaccine. One dose is  recommended after age 69. Talk to your health care provider about which screenings and vaccines you need and how often you need them. This information is not intended to replace advice given to you by your health care provider. Make sure you discuss any questions you have with your health care provider. Document Released: 10/21/2015 Document Revised: 06/13/2016 Document Reviewed: 07/26/2015 Elsevier Interactive Patient Education  2017 [employer] Prevention in the Home Falls can cause injuries. They can happen to people of all ages. There are many things you can do to make your home safe and to help prevent falls. What can I do on the outside of my home?  Regularly fix the edges of walkways and driveways and fix any cracks.  Remove anything that might make you trip as you walk through a door, such as a raised step or threshold.  Trim any bushes or trees on the path to your home.  Use bright outdoor lighting.  Clear any walking paths of anything that might make someone trip, such as rocks or tools.  Regularly check to see if handrails are loose or broken. Make sure that both sides of any steps have handrails.  Any raised decks and porches should have guardrails on the edges.  Have any leaves, snow, or ice cleared regularly.  Use sand or salt on walking paths during winter.  Clean up any spills in your garage right away. This includes oil or grease spills. What can I do in the bathroom?  Use night lights.  Install grab bars by the toilet and in the tub and shower. Do not use towel bars as grab bars.  Use non-skid mats or decals in the tub or shower.  If you need to sit down in the shower, use a plastic, non-slip stool.  Keep the floor dry. Clean up any water that spills on the floor as soon as it happens.  Remove soap buildup in the tub or shower regularly.  Attach bath mats securely with double-sided non-slip rug tape.  Do not have throw rugs and other things on  the floor that can make you trip. What can I do in the bedroom?  Use night lights.  Make sure that you have a light by your bed that is easy to reach.  Do not use any sheets or blankets that are too big for your bed. They should not hang down onto the floor.  Have a firm chair that has side arms. You can use this for support while you get dressed.  Do not have throw rugs and other things on the floor that can make you trip. What can I do in the kitchen?  Clean up any spills right away.  Avoid walking on wet floors.  Keep items that you use a lot in easy-to-reach places.  If you need to reach something above you, use a strong step stool that has a grab bar.  Keep electrical cords out of the way.  Do not use floor polish or wax that makes floors slippery. If you must use wax, use non-skid floor wax.  Do not have throw rugs and other things on the floor that can make you trip. What can I do with my stairs?  Do not leave  any items on the stairs.  Make sure that there are handrails on both sides of the stairs and use them. Fix handrails that are broken or loose. Make sure that handrails are as long as the stairways.  Check any carpeting to make sure that it is firmly attached to the stairs. Fix any carpet that is loose or worn.  Avoid having throw rugs at the top or bottom of the stairs. If you do have throw rugs, attach them to the floor with carpet tape.  Make sure that you have a light switch at the top of the stairs and the bottom of the stairs. If you do not have them, ask someone to add them for you. What else can I do to help prevent falls?  Wear shoes that:  Do not have high heels.  Have rubber bottoms.  Are comfortable and fit you well.  Are closed at the toe. Do not wear sandals.  If you use a stepladder:  Make sure that it is fully opened. Do not climb a closed stepladder.  Make sure that both sides of the stepladder are locked into place.  Ask someone to  hold it for you, if possible.  Clearly mark and make sure that you can see:  Any grab bars or handrails.  First and last steps.  Where the edge of each step is.  Use tools that help you move around (mobility aids) if they are needed. These include:  Canes.  Walkers.  Scooters.  Crutches.  Turn on the lights when you go into a dark area. Replace any light bulbs as soon as they burn out.  Set up your furniture so you have a clear path. Avoid moving your furniture around.  If any of your floors are uneven, fix them.  If there are any pets around you, be aware of where they are.  Review your medicines with your doctor. Some medicines can make you feel dizzy. This can increase your chance of falling. Ask your doctor what other things that you can do to help prevent falls. This information is not intended to replace advice given to you by your health care provider. Make sure you discuss any questions you have with your health care provider. Document Released: 07/21/2009 Document Revised: 03/01/2016 Document Reviewed: 10/29/2014 Elsevier Interactive Patient Education  2017 [employer].

## 2018-03-25 NOTE — Progress Notes (Signed)
Subjective:   JEDIAH HORGER is a 67 y.o. male who presents for Medicare Annual/Subsequent preventive examination.  Review of Systems:  N/A  Cardiac Risk Factors include: advanced age (>85men, >46 women);dyslipidemia;hypertension;male gender     Objective:    Vitals: BP 130/66 (BP Location: Right Arm)   Pulse 66   Temp 98.6 F (37 C) (Oral)   Ht 6' (1.829 m)   Wt 185 lb 12.8 oz (84.3 kg)   BMI 25.20 kg/m   Body mass index is 25.2 kg/m.  Advanced Directives 03/25/2018 05/15/2017 03/13/2017 05/03/2016 08/23/2015  Does Patient Have a Medical Advance Directive? Yes Yes Yes Yes Yes  Type of Advance Directive Living will Living will [place];Living will Living will;Healthcare Power of [place] in Chart? - - No - copy requested No - copy requested -    Tobacco Social History   Tobacco Use  Smoking Status Never Smoker  Smokeless Tobacco Never Used     Counseling given: Not Answered   Clinical Intake:  Pre-visit preparation completed: Yes  Pain : No/denies pain Pain Score: 0-No pain     Nutritional Status: BMI 25 -29 Overweight Nutritional Risks: None Diabetes: No  How often do you need to have someone help you when you read instructions, pamphlets, or other written materials from your doctor or pharmacy?: 1 - Never  Interpreter Needed?: No  Information entered by :: [facility], LPN  Past Medical History:  Diagnosis Date  . Allergy   . Hyperlipidemia   . Hypertension    Past Surgical History:  Procedure Laterality Date  . CHOLECYSTECTOMY  07/05/1996  . COLONOSCOPY WITH PROPOFOL N/A 05/15/2017   Procedure: COLONOSCOPY WITH PROPOFOL;  Surgeon: Robert Bellow, MD;  Location: [facility] ENDOSCOPY;  Service: Endoscopy;  Laterality: N/A;   Family History  Problem Relation Age of Onset  . Heart attack Father   . Heart attack Brother   . Other Brother        brain tumor  . Heart attack Brother        heart  attack x 2  . Heart disease Mother   . Emphysema Mother   . Heart attack Sister   . Heart disease Sister    Social History   Socioeconomic History  . Marital status: Married    Spouse name: Not on file  . Number of children: 1  . Years of education: Not on file  . Highest education level: Bachelor's degree (e.g., BA, AB, BS)  Occupational History  . Occupation: retired  Scientific laboratory technician  . Financial resource strain: Not hard at all  . Food insecurity:    Worry: Never true    Inability: Never true  . Transportation needs:    Medical: No    Non-medical: No  Tobacco Use  . Smoking status: Never Smoker  . Smokeless tobacco: Never Used  Substance and Sexual Activity  . Alcohol use: Yes    Alcohol/week: 1.2 - 1.8 oz    Types: 2 - 3 Cans of beer per week  . Drug use: No  . Sexual activity: Not on file  Lifestyle  . Physical activity:    Days per week: Not on file    Minutes per session: Not on file  . Stress: Not at all  Relationships  . Social connections:    Talks on phone: Not on file    Gets together: Not on file    Attends religious service:  Not on file    Active member of club or organization: Not on file    Attends meetings of clubs or organizations: Not on file    Relationship status: Not on file  Other Topics Concern  . Not on file  Social History Narrative  . Not on file    Outpatient Encounter Medications as of 03/25/2018  Medication Sig  . aspirin 81 MG tablet Take 81 mg by mouth daily.   Marland Kitchen atorvastatin (LIPITOR) 40 MG tablet take 1 tablet by mouth once daily  . Cholecalciferol (VITAMIN D) 2000 UNITS tablet Take 2,000 Units by mouth daily.   Marland Kitchen CINNAMON PO Take 1,000 mg by mouth daily.   . Coenzyme Q10 (CO Q-10) 100 MG CAPS Take 2 capsules by mouth daily.   Marland Kitchen FLUAD 0.5 ML SUSY inject 0.5 milliliter intramuscularly  . fluticasone (FLONASE) 50 MCG/ACT nasal spray instill 2 sprays into each nostril once daily  . lisinopril (PRINIVIL,ZESTRIL) 40 MG tablet Take  1 tablet (40 mg total) by mouth daily.  Marland Kitchen loratadine (CLARITIN) 10 MG tablet Take 10 mg by mouth daily.   . vitamin C (ASCORBIC ACID) 500 MG tablet Take 500 mg by mouth daily.   No facility-administered encounter medications on file as of 03/25/2018.     Activities of Daily Living In your present state of health, do you have any difficulty performing the following activities: 03/25/2018  Hearing? N  Vision? N  Difficulty concentrating or making decisions? N  Walking or climbing stairs? N  Dressing or bathing? N  Doing errands, shopping? N  Preparing Food and eating ? N  Using the Toilet? N  In the past six months, have you accidently leaked urine? N  Do you have problems with loss of bowel control? N  Managing your Medications? N  Managing your Finances? N  Housekeeping or managing your Housekeeping? N  Some recent data might be hidden    Patient Care Team: Jerrol Banana., MD as PCP - General (Family Medicine) Thelma Comp, OD as Consulting Physician (Optometry) Bary Castilla, Forest Gleason, MD as Consulting Physician (General Surgery)   Assessment:   This is a routine wellness examination for [place].  Exercise Activities and Dietary recommendations Current Exercise Habits: Home exercise routine, Type of exercise: walking, Time (Minutes): > 60, Frequency (Times/Week): 7, Weekly Exercise (Minutes/Week): 0, Intensity: Mild, Exercise limited by: None identified  Goals    . DIET - INCREASE WATER INTAKE     Recommend increasing water intake to 4-6 glasses a day.        Fall Risk Fall Risk  03/25/2018 03/13/2017 02/26/2017 02/21/2016  Falls in the past year? No No No No   Is the patient's home free of loose throw rugs in walkways, pet beds, electrical cords, etc?   yes      Grab bars in the bathroom? no      Handrails on the stairs?   no      Adequate lighting?   yes  Timed Get Up and Go Performed: N/A  Depression Screen PHQ 2/9 Scores 03/25/2018 03/13/2017 03/13/2017 02/26/2017    PHQ - 2 Score 0 0 0 0  PHQ- 9 Score - 0 - -    Cognitive Function: Pt declined screening today.      6CIT Screen 03/13/2017  What Year? 0 points  What month? 0 points  What time? 0 points  Count back from 20 0 points  Months in reverse 0 points  Repeat phrase 0 points  Total Score 0    Immunization History  Administered Date(s) Administered  . Influenza Whole 07/11/2017  . Pneumococcal Conjugate-13 01/18/2015  . Pneumococcal Polysaccharide-23 02/21/2016  . Tdap 10/30/2010  . Zoster 10/30/2010  . Zoster Recombinat (Shingrix) 07/18/2017    Qualifies for Shingles Vaccine? Up to date  Screening Tests Health Maintenance  Topic Date Due  . INFLUENZA VACCINE  05/08/2018  . TETANUS/TDAP  10/30/2020  . COLONOSCOPY  05/16/2027  . Hepatitis C Screening  Completed  . PNA vac Low Risk Adult  Completed   Cancer Screenings: Lung: Low Dose CT Chest recommended if Age 93-80 years, 30 pack-year currently smoking OR have quit w/in 15years. Patient does not qualify. Colorectal: Up to date  Additional Screenings:  Hepatitis C Screening: Up to date      Plan:  I have personally reviewed and addressed the Medicare Annual Wellness questionnaire and have noted the following in the patient's chart:  A. Medical and social history B. Use of alcohol, tobacco or illicit drugs  C. Current medications and supplements D. Functional ability and status E.  Nutritional status F.  Physical activity G. Advance directives H. List of other physicians I.  Hospitalizations, surgeries, and ER visits in previous 12 months J.  [place] such as hearing and vision if needed, cognitive and depression L. Referrals and appointments - none  In addition, I have reviewed and discussed with patient certain preventive protocols, quality metrics, and best practice recommendations. A written personalized care plan for preventive services as well as general preventive health recommendations were  provided to patient.  See attached scanned questionnaire for additional information.   Signed,  Fabio Neighbors, LPN Nurse Health Advisor   Nurse Recommendations: None.

## 2018-03-26 ENCOUNTER — Ambulatory Visit (INDEPENDENT_AMBULATORY_CARE_PROVIDER_SITE_OTHER): Payer: Medicare Other | Admitting: Family Medicine

## 2018-03-26 ENCOUNTER — Encounter: Payer: Self-pay | Admitting: Family Medicine

## 2018-03-26 VITALS — BP 130/68 | HR 63 | Temp 97.6°F | Ht 72.0 in | Wt 184.2 lb

## 2018-03-26 DIAGNOSIS — E7800 Pure hypercholesterolemia, unspecified: Secondary | ICD-10-CM | POA: Diagnosis not present

## 2018-03-26 DIAGNOSIS — R7303 Prediabetes: Secondary | ICD-10-CM

## 2018-03-26 DIAGNOSIS — I10 Essential (primary) hypertension: Secondary | ICD-10-CM

## 2018-03-26 DIAGNOSIS — L570 Actinic keratosis: Secondary | ICD-10-CM

## 2018-03-26 NOTE — Progress Notes (Signed)
Patient: Jack Lutz Male    DOB: 05/04/1951   67 y.o.   MRN: [id number] Visit Date: 03/26/2018  Today's Provider: Wilhemena Durie, MD   Chief Complaint  Patient presents with  . Hypertension  . Hyperlipidemia  . Follow-up   Subjective:    HPI Patient presents today for 7 months follow up.Pt retired 2017 and his wife retired 2 months ago. Last office visit was on 03/25/18 for [facility]WE.  HTN: Patient is checking his BP at the pharmacy and readings have been in the normal range. Patient denies any cardiac symptoms today. He is exercising daily walking  BP Readings from Last 3 Encounters:  03/26/18 130/68  03/25/18 130/66  09/04/17 122/74    Hyperlipidemia: Patient is doing well with current medication regimen. He is taking Atorvastatin 40 mg daily. He reports exercising daily walking.   RecentLabs   Lab Results  Component Value Date   CHOL 133 02/26/2017   HDL 49 02/26/2017   LDLCALC 67 02/26/2017   TRIG 87 02/26/2017   CHOLHDL 2.7 02/26/2017   Patient reports no concerns today. He states he is feeling and sleeping well.   No Known Allergies   Current Outpatient Medications:  .  aspirin 81 MG tablet, Take 81 mg by mouth daily. , Disp: , Rfl:  .  atorvastatin (LIPITOR) 40 MG tablet, take 1 tablet by mouth once daily, Disp: 90 tablet, Rfl: 3 .  Cholecalciferol (VITAMIN D) 2000 UNITS tablet, Take 2,000 Units by mouth daily. , Disp: , Rfl:  .  CINNAMON PO, Take 1,000 mg by mouth daily. , Disp: , Rfl:  .  Coenzyme Q10 (CO Q-10) 100 MG CAPS, Take 2 capsules by mouth daily. , Disp: , Rfl:  .  FLUAD 0.5 ML SUSY, inject 0.5 milliliter intramuscularly, Disp: , Rfl: 0 .  fluticasone (FLONASE) 50 MCG/ACT nasal spray, instill 2 sprays into each nostril once daily, Disp: 16 g, Rfl: 12 .  lisinopril (PRINIVIL,ZESTRIL) 40 MG tablet, Take 1 tablet (40 mg total) by mouth daily., Disp: 90 tablet, Rfl: 3 .  loratadine (CLARITIN) 10 MG tablet, Take 10 mg by mouth daily. , Disp:  , Rfl:  .  vitamin C (ASCORBIC ACID) 500 MG tablet, Take 500 mg by mouth daily., Disp: , Rfl:   Review of Systems  Constitutional: Negative.   Respiratory: Negative.   Cardiovascular: Negative.   Allergic/Immunologic: Negative.   Psychiatric/Behavioral: Negative.     Social History   Tobacco Use  . Smoking status: Never Smoker  . Smokeless tobacco: Never Used  Substance Use Topics  . Alcohol use: Yes    Alcohol/week: 1.2 - 1.8 oz    Types: 2 - 3 Cans of beer per week   Objective:   BP 130/68 (BP Location: Right Arm, Patient Position: Sitting, Cuff Size: Normal)   Pulse 63   Temp 97.6 F (36.4 C) (Oral)   Ht 6' (1.829 m)   Wt 184 lb 3.2 oz (83.6 kg)   SpO2 99%   BMI 24.98 kg/m    Physical Exam  Constitutional: He is oriented to person, place, and time. He appears well-developed and well-nourished.  HENT:  Head: Normocephalic and atraumatic.  Right Ear: External ear normal.  Left Ear: External ear normal.  Nose: Nose normal.  Eyes: Pupils are equal, round, and reactive to light. Conjunctivae and EOM are normal.  Neck: Normal range of motion. Neck supple.  Cardiovascular: Normal rate, regular rhythm, normal heart sounds and  intact distal pulses.  Pulmonary/Chest: Effort normal and breath sounds normal.  Abdominal: Soft. Bowel sounds are normal.  Musculoskeletal: Normal range of motion.  Neurological: He is alert and oriented to person, place, and time.  Skin: Skin is warm and dry.  Psychiatric: He has a normal mood and affect. His behavior is normal. Judgment and thought content normal.        Assessment & Plan:    1. Essential (primary) hypertension  - CBC with Differential - TSH - Comprehensive Metabolic Panel (CMET)  2. Hypercholesterolemia  - Lipid Profile  3. Borderline diabetes - HgB A1c  4. Actinic keratosis  - Ambulatory referral to Dermatology 5.HM Pt has had both Shingrex.   Ioannis Schuh Cranford Mon, MD  [facility]

## 2018-03-26 NOTE — Progress Notes (Deleted)
Patient: Jack Lutz, Male    DOB: Feb 17, 1951, 67 y.o.   MRN: [id number] Visit Date: 03/26/2018  Today's Provider: Wilhemena Durie, MD   Chief Complaint  Patient presents with  . Annual Exam   Subjective:       Review of Systems  Constitutional: Negative.   HENT: Negative.   Eyes: Negative.   Respiratory: Negative.   Cardiovascular: Negative.   Gastrointestinal: Negative.   Endocrine: Negative.   Genitourinary: Negative.   Musculoskeletal: Negative.   Skin: Negative.   Allergic/Immunologic: Negative.   Neurological: Negative.   Hematological: Negative.   Psychiatric/Behavioral: Negative.     Social History   Socioeconomic History  . Marital status: Married    Spouse name: Not on file  . Number of children: 1  . Years of education: Not on file  . Highest education level: Bachelor's degree (e.g., BA, AB, BS)  Occupational History  . Occupation: retired  Scientific laboratory technician  . Financial resource strain: Not hard at all  . Food insecurity:    Worry: Never true    Inability: Never true  . Transportation needs:    Medical: No    Non-medical: No  Tobacco Use  . Smoking status: Never Smoker  . Smokeless tobacco: Never Used  Substance and Sexual Activity  . Alcohol use: Yes    Alcohol/week: 1.2 - 1.8 oz    Types: 2 - 3 Cans of beer per week  . Drug use: No  . Sexual activity: Not on file  Lifestyle  . Physical activity:    Days per week: Not on file    Minutes per session: Not on file  . Stress: Not at all  Relationships  . Social connections:    Talks on phone: Not on file    Gets together: Not on file    Attends religious service: Not on file    Active member of club or organization: Not on file    Attends meetings of clubs or organizations: Not on file    Relationship status: Not on file  . Intimate partner violence:    Fear of current or ex partner: Not on file    Emotionally abused: Not on file    Physically abused: Not on file    Forced sexual  activity: Not on file  Other Topics Concern  . Not on file  Social History Narrative  . Not on file    Past Medical History:  Diagnosis Date  . Allergy   . Hyperlipidemia   . Hypertension      Patient Active Problem List   Diagnosis Date Noted  . Encounter for screening colonoscopy 03/18/2017  . 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 Sebaceous cyst 05/03/2016  . 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 Essential (primary) hypertension 08/22/2015  . Hypercholesterolemia 08/22/2015  . Adiposity 08/22/2015  . Allergic rhinitis 08/22/2015  . 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 Avitaminosis D 08/22/2015    Past Surgical History:  Procedure Laterality Date  . CHOLECYSTECTOMY  07/05/1996  . COLONOSCOPY WITH PROPOFOL N/A 05/15/2017   Procedure: COLONOSCOPY WITH PROPOFOL;  Surgeon: Robert Bellow, MD;  Location: [facility] ENDOSCOPY;  Service: Endoscopy;  Laterality: N/A;    His family history includes Emphysema in his mother; Heart attack in his brother, brother, father, and sister; Heart disease in his mother and sister; Other in his brother.      Current Outpatient Medications:  .  aspirin 81 MG tablet, Take 81 mg by mouth daily. ,  Disp: , Rfl:  .  atorvastatin (LIPITOR) 40 MG tablet, take 1 tablet by mouth once daily, Disp: 90 tablet, Rfl: 3 .  Cholecalciferol (VITAMIN D) 2000 UNITS tablet, Take 2,000 Units by mouth daily. , Disp: , Rfl:  .  CINNAMON PO, Take 1,000 mg by mouth daily. , Disp: , Rfl:  .  Coenzyme Q10 (CO Q-10) 100 MG CAPS, Take 2 capsules by mouth daily. , Disp: , Rfl:  .  FLUAD 0.5 ML SUSY, inject 0.5 milliliter intramuscularly, Disp: , Rfl: 0 .  fluticasone (FLONASE) 50 MCG/ACT nasal spray, instill 2 sprays into each nostril once daily, Disp: 16 g, Rfl: 12 .  lisinopril (PRINIVIL,ZESTRIL) 40 MG tablet, Take 1 tablet (40 mg total) by mouth daily., Disp: 90 tablet, Rfl: 3 .  loratadine (CLARITIN) 10 MG tablet, Take 10 mg by mouth daily. , Disp: , Rfl:  .  vitamin C  (ASCORBIC ACID) 500 MG tablet, Take 500 mg by mouth daily., Disp: , Rfl:   Patient Care Team: Jerrol Banana., MD as PCP - General (Family Medicine) Thelma Comp, OD as Consulting Physician (Optometry) Bary Castilla, Forest Gleason, MD as Consulting Physician (General Surgery)     Objective:   Vitals: BP 130/68 (BP Location: Right Arm, Patient Position: Sitting, Cuff Size: Normal)   Pulse 63   Temp 97.6 F (36.4 C) (Oral)   Ht 6' (1.829 m)   Wt 184 lb 3.2 oz (83.6 kg)   SpO2 99%   BMI 24.98 kg/m   Physical Exam  Activities of Daily Living In your present state of health, do you have any difficulty performing the following activities: 03/25/2018  Hearing? N  Vision? N  Difficulty concentrating or making decisions? N  Walking or climbing stairs? N  Dressing or bathing? N  Doing errands, shopping? N  Preparing Food and eating ? N  Using the Toilet? N  In the past six months, have you accidently leaked urine? N  Do you have problems with loss of bowel control? N  Managing your Medications? N  Managing your Finances? N  Housekeeping or managing your Housekeeping? N  Some recent data might be hidden    Fall Risk Assessment Fall Risk  03/25/2018 03/13/2017 02/26/2017 02/21/2016  Falls in the past year? No No No No     Depression Screen PHQ 2/9 Scores 03/25/2018 03/13/2017 03/13/2017 02/26/2017  PHQ - 2 Score 0 0 0 0  PHQ- 9 Score - 0 - -    Assessment & Plan:    Annual Physical Reviewed patient's Family Medical History Reviewed and updated list of patient's medical providers Assessment of cognitive impairment was done Assessed patient's functional ability Established a written schedule for health screening [place] Completed and Reviewed  Exercise Activities and Dietary recommendations Goals    . DIET - INCREASE WATER INTAKE     Recommend increasing water intake to 4-6 glasses a day.        Immunization History  Administered Date(s)  Administered  . Influenza Whole 07/11/2017  . Pneumococcal Conjugate-13 01/18/2015  . Pneumococcal Polysaccharide-23 02/21/2016  . Tdap 10/30/2010  . Zoster 10/30/2010  . Zoster Recombinat (Shingrix) 07/18/2017, 09/25/2017    Health Maintenance  Topic Date Due  . INFLUENZA VACCINE  05/08/2018  . TETANUS/TDAP  10/30/2020  . COLONOSCOPY  05/16/2027  . Hepatitis C Screening  Completed  . PNA vac Low Risk Adult  Completed     Discussed health benefits of physical activity, and encouraged him to  engage in regular exercise appropriate for his age and condition.    ------------------------------------------------------------------------------------------------------------    Wilhemena Durie, MD  [place]

## 2018-03-27 LAB — HEMOGLOBIN A1C
ESTIMATED AVERAGE GLUCOSE: 120 mg/dL
Hgb A1c MFr Bld: 5.8 % — ABNORMAL HIGH (ref 4.8–5.6)

## 2018-03-27 LAB — COMPREHENSIVE METABOLIC PANEL
ALBUMIN: 4.7 g/dL (ref 3.6–4.8)
ALT: 25 IU/L (ref 0–44)
AST: 23 IU/L (ref 0–40)
Albumin/Globulin Ratio: 2.1 (ref 1.2–2.2)
Alkaline Phosphatase: 83 IU/L (ref 39–117)
BUN/Creatinine Ratio: 14 (ref 10–24)
BUN: 17 mg/dL (ref 8–27)
Bilirubin Total: 0.8 mg/dL (ref 0.0–1.2)
CALCIUM: 9.9 mg/dL (ref 8.6–10.2)
CO2: 21 mmol/L (ref 20–29)
CREATININE: 1.19 mg/dL (ref 0.76–1.27)
Chloride: 104 mmol/L (ref 96–106)
GFR calc Af Amer: 72 mL/min/{1.73_m2} (ref >59)
GFR, EST NON AFRICAN AMERICAN: 62 mL/min/{1.73_m2} (ref >59)
GLOBULIN, TOTAL: 2.2 g/dL (ref 1.5–4.5)
Glucose: 99 mg/dL (ref 65–99)
Potassium: 4.6 mmol/L (ref 3.5–5.2)
SODIUM: 140 mmol/L (ref 134–144)
Total Protein: 6.9 g/dL (ref 6.0–8.5)

## 2018-03-27 LAB — CBC WITH DIFFERENTIAL/PLATELET
BASOS ABS: 0 10*3/uL (ref 0.0–0.2)
Basos: 0 %
EOS (ABSOLUTE): 0.2 10*3/uL (ref 0.0–0.4)
Eos: 3 %
Hematocrit: 47.1 % (ref 37.5–51.0)
Hemoglobin: 16.6 g/dL (ref 13.0–17.7)
Immature Grans (Abs): 0 10*3/uL (ref 0.0–0.1)
Immature Granulocytes: 0 %
LYMPHS ABS: 1.7 10*3/uL (ref 0.7–3.1)
Lymphs: 25 %
MCH: 31.8 pg (ref 26.6–33.0)
MCHC: 35.2 g/dL (ref 31.5–35.7)
MCV: 90 fL (ref 79–97)
Monocytes Absolute: 0.6 10*3/uL (ref 0.1–0.9)
Monocytes: 9 %
Neutrophils Absolute: 4.4 10*3/uL (ref 1.4–7.0)
Neutrophils: 63 %
Platelets: 202 10*3/uL (ref 150–450)
RBC: 5.22 x10E6/uL (ref 4.14–5.80)
RDW: 14 % (ref 12.3–15.4)
WBC: 6.9 10*3/uL (ref 3.4–10.8)

## 2018-03-27 LAB — TSH: TSH: 3.52 u[IU]/mL (ref 0.450–4.500)

## 2018-03-27 LAB — LIPID PANEL
CHOLESTEROL TOTAL: 124 mg/dL (ref 100–199)
Chol/HDL Ratio: 2.8 ratio (ref 0.0–5.0)
HDL: 44 mg/dL (ref >39)
LDL Calculated: 59 mg/dL (ref 0–99)
TRIGLYCERIDES: 104 mg/dL (ref 0–149)
VLDL Cholesterol Cal: 21 mg/dL (ref 5–40)

## 2018-03-27 NOTE — Progress Notes (Signed)
Advised  ED 

## 2018-05-16 ENCOUNTER — Other Ambulatory Visit: Payer: Self-pay | Admitting: Family Medicine

## 2018-05-29 DIAGNOSIS — L578 Other skin changes due to chronic exposure to nonionizing radiation: Secondary | ICD-10-CM | POA: Diagnosis not present

## 2018-05-29 DIAGNOSIS — L570 Actinic keratosis: Secondary | ICD-10-CM | POA: Diagnosis not present

## 2018-05-29 DIAGNOSIS — L720 Epidermal cyst: Secondary | ICD-10-CM | POA: Diagnosis not present

## 2018-05-29 DIAGNOSIS — L821 Other seborrheic keratosis: Secondary | ICD-10-CM | POA: Diagnosis not present

## 2018-08-13 ENCOUNTER — Other Ambulatory Visit: Payer: Self-pay | Admitting: Family Medicine

## 2018-08-19 DIAGNOSIS — Z23 Encounter for immunization: Secondary | ICD-10-CM | POA: Diagnosis not present

## 2018-12-01 DIAGNOSIS — L821 Other seborrheic keratosis: Secondary | ICD-10-CM | POA: Diagnosis not present

## 2018-12-01 DIAGNOSIS — L570 Actinic keratosis: Secondary | ICD-10-CM | POA: Diagnosis not present

## 2018-12-01 DIAGNOSIS — L578 Other skin changes due to chronic exposure to nonionizing radiation: Secondary | ICD-10-CM | POA: Diagnosis not present

## 2018-12-24 ENCOUNTER — Other Ambulatory Visit: Payer: Self-pay | Admitting: Family Medicine

## 2018-12-24 NOTE — Telephone Encounter (Signed)
[facility] faxed refill request for the following medications:  atorvastatin (LIPITOR) 40 MG tablet    Please advise.  

## 2018-12-25 MED ORDER — ATORVASTATIN CALCIUM 40 MG PO TABS: 40.0000 mg | ORAL_TABLET | Freq: Every day | ORAL | 3 refills | Status: DC

## 2018-12-26 ENCOUNTER — Telehealth: Payer: Self-pay | Admitting: Family Medicine

## 2018-12-26 MED ORDER — ATORVASTATIN CALCIUM 40 MG PO TABS: 40.0000 mg | ORAL_TABLET | Freq: Every day | ORAL | 3 refills | Status: DC

## 2018-12-26 NOTE — Telephone Encounter (Signed)
Done

## 2018-12-26 NOTE — Telephone Encounter (Signed)
Pt  Called saying he changed pharmacies.  [facility] is now using [facility] [address] and he needs a refill on his Atorvastatin 40 mg.  990 day supply  CB#  [phone]  Thanks Con Memos

## 2019-03-27 ENCOUNTER — Ambulatory Visit: Payer: Medicare Other

## 2019-03-31 ENCOUNTER — Encounter: Payer: Medicare Other | Admitting: Family Medicine

## 2019-04-07 ENCOUNTER — Other Ambulatory Visit: Payer: Self-pay | Admitting: Family Medicine

## 2019-05-07 ENCOUNTER — Other Ambulatory Visit: Payer: Self-pay

## 2019-07-01 DIAGNOSIS — Z23 Encounter for immunization: Secondary | ICD-10-CM | POA: Diagnosis not present

## 2019-09-14 ENCOUNTER — Telehealth: Payer: Self-pay

## 2019-09-14 NOTE — Telephone Encounter (Signed)
Copied from [place] [phone] [phone]. Topic: General - Other >> Sep 14, 2019  9:28 AM Antonieta Iba C wrote: Reason for CRM: pt called in to be advised about scheduling his  AMW, pt would like to speak back with [facility]    CB: [phone]

## 2019-09-14 NOTE — Telephone Encounter (Signed)
Copied from [place] [id number]. Topic: General - Other >> Sep 14, 2019  9:28 AM Antonieta Iba C wrote: Reason for CRM: pt called in to be advised about scheduling his  AMW, pt would like to speak back with [facility]    CB: [phone]

## 2019-09-28 NOTE — Progress Notes (Signed)
Subjective:   Jack Lutz is a 68 y.o. male who presents for Medicare Annual/Subsequent preventive examination.    This visit is being conducted through telemedicine due to the COVID-19 pandemic. This patient has given me verbal consent via doximity to conduct this visit, patient states they are participating from their home address. Some vital signs may be absent or patient reported.    Patient identification: identified by name, DOB, and current address  Review of Systems:  N/A  Cardiac Risk Factors include: advanced age (>2men, >15 women);dyslipidemia;hypertension;male gender     Objective:    Vitals: There were no vitals taken for this visit.  There is no height or weight on file to calculate BMI. Unable to obtain vitals due to visit being conducted via telephonically.   Advanced Directives 03/25/2018 05/15/2017 03/13/2017 05/03/2016 08/23/2015  Does Patient Have a Medical Advance Directive? Yes Yes Yes Yes Yes  Type of Advance Directive Living will Living will [place];Living will Living will;Healthcare Power of [place] in Chart? - - No - copy requested No - copy requested -    Tobacco Social History   Tobacco Use  Smoking Status Never Smoker  Smokeless Tobacco Never Used     Counseling given: Not Answered   Clinical Intake:  Pre-visit preparation completed: Yes  Pain : No/denies pain Pain Score: 0-No pain     Nutritional Risks: None Diabetes: No  How often do you need to have someone help you when you read instructions, pamphlets, or other written materials from your doctor or pharmacy?: 1 - Never  Interpreter Needed?: No  Information entered by :: [facility], LPN  Past Medical History:  Diagnosis Date  . Allergy   . Hyperlipidemia   . Hypertension    Past Surgical History:  Procedure Laterality Date  . CHOLECYSTECTOMY  07/05/1996  . COLONOSCOPY WITH PROPOFOL N/A 05/15/2017   Procedure:  COLONOSCOPY WITH PROPOFOL;  Surgeon: Robert Bellow, MD;  Location: [facility] ENDOSCOPY;  Service: Endoscopy;  Laterality: N/A;   Family History  Problem Relation Age of Onset  . Heart attack Father   . Heart attack Brother   . Other Brother        brain tumor  . Heart attack Brother        heart attack x 2  . Heart disease Mother   . Emphysema Mother   . Heart attack Sister   . Heart disease Sister    Social History   Socioeconomic History  . Marital status: Married    Spouse name: Not on file  . Number of children: 1  . Years of education: Not on file  . Highest education level: Bachelor's degree (e.g., BA, AB, BS)  Occupational History  . Occupation: retired  Tobacco Use  . Smoking status: Never Smoker  . Smokeless tobacco: Never Used  Substance and Sexual Activity  . Alcohol use: Yes    Alcohol/week: 0.0 - 5.0 standard drinks  . Drug use: No  . Sexual activity: Not on file  Other Topics Concern  . Not on file  Social History Narrative  . Not on file   Social Determinants of Health   Financial Resource Strain: Low Risk   . Difficulty of Paying Living Expenses: Not hard at all  Food Insecurity: No Food Insecurity  . Worried About Charity fundraiser in the Last Year: Never true  . Ran Out of Food in the Last Year: Never  true  Transportation Needs: No Transportation Needs  . Lack of Transportation (Medical): No  . Lack of Transportation (Non-Medical): No  Physical Activity: Sufficiently Active  . Days of Exercise per Week: 6 days  . Minutes of Exercise per Session: 30 min  Stress: No Stress Concern Present  . Feeling of Stress : Not at all  Social Connections: Slightly Isolated  . Frequency of Communication with Friends and Family: Twice a week  . Frequency of Social Gatherings with Friends and Family: Twice a week  . Attends Religious Services: More than 4 times per year  . Active Member of Clubs or Organizations: No  . Attends Archivist Meetings:  Never  . Marital Status: Married    Outpatient Encounter Medications as of 09/29/2019  Medication Sig  . aspirin 81 MG tablet Take 81 mg by mouth daily.   Marland Kitchen atorvastatin (LIPITOR) 40 MG tablet Take 1 tablet (40 mg total) by mouth daily.  . Cholecalciferol (VITAMIN D) 2000 UNITS tablet Take 2,000 Units by mouth daily.   Marland Kitchen CINNAMON PO Take 1,000 mg by mouth daily.   . Coenzyme Q10 (CO Q-10) 100 MG CAPS Take 200 mg by mouth daily.   . fluticasone (FLONASE) 50 MCG/ACT nasal spray instill 2 sprays into each nostril once daily  . lisinopril (ZESTRIL) 40 MG tablet TAKE 1 TABLET BY MOUTH EVERY DAY  . loratadine (CLARITIN) 10 MG tablet Take 10 mg by mouth daily.   . vitamin C (ASCORBIC ACID) 500 MG tablet Take 500 mg by mouth daily.  Marland Kitchen FLUAD 0.5 ML SUSY inject 0.5 milliliter intramuscularly   No facility-administered encounter medications on file as of 09/29/2019.    Activities of Daily Living In your present state of health, do you have any difficulty performing the following activities: 09/29/2019  Hearing? N  Vision? N  Difficulty concentrating or making decisions? N  Walking or climbing stairs? N  Dressing or bathing? N  Doing errands, shopping? N  Preparing Food and eating ? N  Using the Toilet? N  In the past six months, have you accidently leaked urine? N  Do you have problems with loss of bowel control? N  Managing your Medications? N  Managing your Finances? N  Housekeeping or managing your Housekeeping? N  Some recent data might be hidden    Patient Care Team: Jerrol Banana., MD as PCP - General (Family Medicine) Thelma Comp, [place] as Consulting Physician (Optometry) Bary Castilla, Forest Gleason, MD as Consulting Physician (General Surgery) Ralene Bathe, MD (Dermatology)   Assessment:   This is a routine wellness examination for Jack Lutz.  Exercise Activities and Dietary recommendations Current Exercise Habits: Home exercise routine, Type of exercise: walking, Time  (Minutes): 35, Frequency (Times/Week): 6, Weekly Exercise (Minutes/Week): 210, Intensity: Mild, Exercise limited by: None identified  Goals    . Increase water intake     Recommend increasing water intake to 6-8 8 oz glasses a day.       Fall Risk: Fall Risk  09/29/2019 05/07/2019 03/25/2018 03/13/2017 02/26/2017  Falls in the past year? 1 0 No No No  Comment - Emmi Telephone Survey: data to providers prior to load - - -  Number falls in past yr: 0 - - - -  Injury with Fall? 0 - - - -  Follow up Falls prevention discussed - - - -    FALL RISK PREVENTION PERTAINING TO THE HOME:  Any stairs in or around the home? Yes  If so,  are there any without handrails? No   Home free of loose throw rugs in walkways, pet beds, electrical cords, etc? Yes  Adequate lighting in your home to reduce risk of falls? Yes   ASSISTIVE DEVICES UTILIZED TO PREVENT FALLS:  Life alert? No  Use of a cane, walker or w/c? No  Grab bars in the bathroom? No  Shower chair or bench in shower? No  Elevated toilet seat or a handicapped toilet? No   TIMED UP AND GO:  Was the test performed? No .    Depression Screen PHQ 2/9 Scores 09/29/2019 09/29/2019 03/25/2018 03/13/2017  PHQ - 2 Score 0 0 0 0  PHQ- 9 Score - - - 0    Cognitive Function     6CIT Screen 09/29/2019 03/13/2017  What Year? 0 points 0 points  What month? 0 points 0 points  What time? 0 points 0 points  Count back from 20 0 points 0 points  Months in reverse 0 points 0 points  Repeat phrase 0 points 0 points  Total Score 0 0    Immunization History  Administered Date(s) Administered  . Influenza Whole 07/11/2017  . Influenza, High Dose Seasonal PF 08/19/2018, 07/01/2019  . Pneumococcal Conjugate-13 01/18/2015  . Pneumococcal Polysaccharide-23 02/21/2016  . Tdap 10/30/2010  . Zoster 10/30/2010  . Zoster Recombinat (Shingrix) 07/18/2017, 09/25/2017    Qualifies for Shingles Vaccine? Completed series  Tdap: Up to date  Flu Vaccine:  Up to date  Pneumococcal Vaccine: Completed series   Screening Tests Health Maintenance  Topic Date Due  . TETANUS/TDAP  10/30/2020  . COLONOSCOPY  05/16/2027  . INFLUENZA VACCINE  Completed  . Hepatitis C Screening  Completed  . PNA vac Low Risk Adult  Completed   Cancer Screenings:  Colorectal Screening: Completed 05/24/17. Repeat every 10 years.  Lung Cancer Screening: (Low Dose CT Chest recommended if Age 18-80 years, 30 pack-year currently smoking OR have quit w/in 15years.) does not qualify.   Additional Screening:  Hepatitis C Screening: Up to date  Vision Screening: Recommended annual ophthalmology exams for early detection of glaucoma and other disorders of the eye.  Dental Screening: Recommended annual dental exams for proper oral hygiene  Community Resource Referral:  CRR required this visit?  No        Plan:  I have personally reviewed and addressed the Medicare Annual Wellness questionnaire and have noted the following in the patient's chart:  A. Medical and social history B. Use of alcohol, tobacco or illicit drugs  C. Current medications and supplements D. Functional ability and status E.  Nutritional status F.  Physical activity G. Advance directives H. List of other physicians I.  Hospitalizations, surgeries, and ER visits in previous 12 months J.  [place] such as hearing and vision if needed, cognitive and depression L. Referrals and appointments   In addition, I have reviewed and discussed with patient certain preventive protocols, quality metrics, and best practice recommendations. A written personalized care plan for preventive services as well as general preventive health recommendations were provided to patient.   [place] Score, [place]  [id number] Nurse Health Advisor   Nurse Notes: None.

## 2019-09-29 ENCOUNTER — Ambulatory Visit (INDEPENDENT_AMBULATORY_CARE_PROVIDER_SITE_OTHER): Payer: Medicare Other

## 2019-09-29 ENCOUNTER — Other Ambulatory Visit: Payer: Self-pay

## 2019-09-29 DIAGNOSIS — Z0000 Encounter for general adult medical examination without abnormal findings: Secondary | ICD-10-CM | POA: Diagnosis not present

## 2019-09-29 NOTE — Patient Instructions (Signed)
Jack Lutz , Thank you for taking time to come for your Medicare Wellness Visit. I appreciate your ongoing commitment to your health goals. Please review the following plan we discussed and let me know if I can assist you in the future.   Screening recommendations/referrals: Colonoscopy: Up to date, due 05/2027 Recommended yearly ophthalmology/optometry visit for glaucoma screening and checkup Recommended yearly dental visit for hygiene and checkup  Vaccinations: Influenza vaccine: Up to date Pneumococcal vaccine: Completed series Tdap vaccine: Up to date, due 10/2020 Shingles vaccine: Completed series    Advanced directives: Currently on file.  Conditions/risks identified: Recommend to increase water intake to 6-8 8 oz glasses a day.   Next appointment: 10/29/19 @ 10:20 AM with Dr Rosanna Randy. Declined scheduling an AWV for 2021 at this time.   Preventive Care 70 Years and Older, Male Preventive care refers to lifestyle choices and visits with your health care provider that can promote health and wellness. What does preventive care include?  A yearly physical exam. This is also called an annual well check.  Dental exams once or twice a year.  Routine eye exams. Ask your health care provider how often you should have your eyes checked.  Personal lifestyle choices, including:  Daily care of your teeth and gums.  Regular physical activity.  Eating a healthy diet.  Avoiding tobacco and drug use.  Limiting alcohol use.  Practicing safe sex.  Taking low doses of aspirin every day.  Taking vitamin and mineral supplements as recommended by your health care provider. What happens during an annual well check? The services and screenings done by your health care provider during your annual well check will depend on your age, overall health, lifestyle risk factors, and family history of disease. Counseling  Your health care provider may ask you questions about your:  Alcohol  use.  Tobacco use.  Drug use.  Emotional well-being.  Home and relationship well-being.  Sexual activity.  Eating habits.  History of falls.  Memory and ability to understand (cognition).  Work and work Statistician. Screening  You may have the following tests or measurements:  Height, weight, and BMI.  Blood pressure.  Lipid and cholesterol levels. These may be checked every 5 years, or more frequently if you are over 4 years old.  Skin check.  Lung cancer screening. You may have this screening every year starting at age 23 if you have a 30-pack-year history of smoking and currently smoke or have quit within the past 15 years.  Fecal occult blood test (FOBT) of the stool. You may have this test every year starting at age 89.  Flexible sigmoidoscopy or colonoscopy. You may have a sigmoidoscopy every 5 years or a colonoscopy every 10 years starting at age 75.  Prostate cancer screening. Recommendations will vary depending on your family history and other risks.  Hepatitis C blood test.  Hepatitis B blood test.  Sexually transmitted disease (STD) testing.  Diabetes screening. This is done by checking your blood sugar (glucose) after you have not eaten for a while (fasting). You may have this done every 1-3 years.  Abdominal aortic aneurysm (AAA) screening. You may need this if you are a current or former smoker.  Osteoporosis. You may be screened starting at age 12 if you are at high risk. Talk with your health care provider about your test results, treatment options, and if necessary, the need for more tests. Vaccines  Your health care provider may recommend certain vaccines, such as:  Influenza  vaccine. This is recommended every year.  Tetanus, diphtheria, and acellular pertussis (Tdap, Td) vaccine. You may need a Td booster every 10 years.  Zoster vaccine. You may need this after age 64.  Pneumococcal 13-valent conjugate (PCV13) vaccine. One dose is  recommended after age 80.  Pneumococcal polysaccharide (PPSV23) vaccine. One dose is recommended after age 63. Talk to your health care provider about which screenings and vaccines you need and how often you need them. This information is not intended to replace advice given to you by your health care provider. Make sure you discuss any questions you have with your health care provider. Document Released: 10/21/2015 Document Revised: 06/13/2016 Document Reviewed: 07/26/2015 Elsevier Interactive Patient Education  2017 [employer] Prevention in the Home Falls can cause injuries. They can happen to people of all ages. There are many things you can do to make your home safe and to help prevent falls. What can I do on the outside of my home?  Regularly fix the edges of walkways and driveways and fix any cracks.  Remove anything that might make you trip as you walk through a door, such as a raised step or threshold.  Trim any bushes or trees on the path to your home.  Use bright outdoor lighting.  Clear any walking paths of anything that might make someone trip, such as rocks or tools.  Regularly check to see if handrails are loose or broken. Make sure that both sides of any steps have handrails.  Any raised decks and porches should have guardrails on the edges.  Have any leaves, snow, or ice cleared regularly.  Use sand or salt on walking paths during winter.  Clean up any spills in your garage right away. This includes oil or grease spills. What can I do in the bathroom?  Use night lights.  Install grab bars by the toilet and in the tub and shower. Do not use towel bars as grab bars.  Use non-skid mats or decals in the tub or shower.  If you need to sit down in the shower, use a plastic, non-slip stool.  Keep the floor dry. Clean up any water that spills on the floor as soon as it happens.  Remove soap buildup in the tub or shower regularly.  Attach bath mats  securely with double-sided non-slip rug tape.  Do not have throw rugs and other things on the floor that can make you trip. What can I do in the bedroom?  Use night lights.  Make sure that you have a light by your bed that is easy to reach.  Do not use any sheets or blankets that are too big for your bed. They should not hang down onto the floor.  Have a firm chair that has side arms. You can use this for support while you get dressed.  Do not have throw rugs and other things on the floor that can make you trip. What can I do in the kitchen?  Clean up any spills right away.  Avoid walking on wet floors.  Keep items that you use a lot in easy-to-reach places.  If you need to reach something above you, use a strong step stool that has a grab bar.  Keep electrical cords out of the way.  Do not use floor polish or wax that makes floors slippery. If you must use wax, use non-skid floor wax.  Do not have throw rugs and other things on the floor that can  make you trip. What can I do with my stairs?  Do not leave any items on the stairs.  Make sure that there are handrails on both sides of the stairs and use them. Fix handrails that are broken or loose. Make sure that handrails are as long as the stairways.  Check any carpeting to make sure that it is firmly attached to the stairs. Fix any carpet that is loose or worn.  Avoid having throw rugs at the top or bottom of the stairs. If you do have throw rugs, attach them to the floor with carpet tape.  Make sure that you have a light switch at the top of the stairs and the bottom of the stairs. If you do not have them, ask someone to add them for you. What else can I do to help prevent falls?  Wear shoes that:  Do not have high heels.  Have rubber bottoms.  Are comfortable and fit you well.  Are closed at the toe. Do not wear sandals.  If you use a stepladder:  Make sure that it is fully opened. Do not climb a closed  stepladder.  Make sure that both sides of the stepladder are locked into place.  Ask someone to hold it for you, if possible.  Clearly mark and make sure that you can see:  Any grab bars or handrails.  First and last steps.  Where the edge of each step is.  Use tools that help you move around (mobility aids) if they are needed. These include:  Canes.  Walkers.  Scooters.  Crutches.  Turn on the lights when you go into a dark area. Replace any light bulbs as soon as they burn out.  Set up your furniture so you have a clear path. Avoid moving your furniture around.  If any of your floors are uneven, fix them.  If there are any pets around you, be aware of where they are.  Review your medicines with your doctor. Some medicines can make you feel dizzy. This can increase your chance of falling. Ask your doctor what other things that you can do to help prevent falls. This information is not intended to replace advice given to you by your health care provider. Make sure you discuss any questions you have with your health care provider. Document Released: 07/21/2009 Document Revised: 03/01/2016 Document Reviewed: 10/29/2014 Elsevier Interactive Patient Education  2017 [employer].

## 2019-10-29 ENCOUNTER — Encounter: Payer: Medicare Other | Admitting: Family Medicine

## 2019-11-03 ENCOUNTER — Ambulatory Visit: Payer: Medicare Other

## 2019-11-12 ENCOUNTER — Ambulatory Visit: Payer: Medicare Other

## 2019-11-24 ENCOUNTER — Ambulatory Visit: Payer: Medicare Other

## 2019-12-22 NOTE — Progress Notes (Signed)
Patient: Jack Lutz Male    DOB: 10-10-1950   69 y.o.   MRN: [id number] Visit Date: 12/23/2019  Today's Provider: Wilhemena Durie, MD   Chief Complaint  Patient presents with  . Follow-up   Subjective:    Patient had AWV with [facility] 09/29/2019.  HPI  Overall patient feels well.  Did have a presyncopal event in October 2020.  No seizure no true syncope.  He fell to the ground and has had mild pain in the right greater trochanter area since then especially when driving for distances. He does have decreased sensation in the right great toe.  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 Essential (primary) hypertension From 03/27/2019-labs checked showing-stable.  Hypercholesterolemia From 03/27/2019-labs checked showing-stable.  Borderline diabetes From 03/27/2019-HgB A1c 5.8.   No Known Allergies   Current Outpatient Medications:  .  aspirin 81 MG tablet, Take 81 mg by mouth daily. , Disp: , Rfl:  .  atorvastatin (LIPITOR) 40 MG tablet, Take 1 tablet (40 mg total) by mouth daily., Disp: 90 tablet, Rfl: 3 .  Cholecalciferol (VITAMIN D) 2000 UNITS tablet, Take 2,000 Units by mouth daily. , Disp: , Rfl:  .  CINNAMON PO, Take 1,000 mg by mouth daily. , Disp: , Rfl:  .  Coenzyme Q10 (CO Q-10) 100 MG CAPS, Take 200 mg by mouth daily. , Disp: , Rfl:  .  fluticasone (FLONASE) 50 MCG/ACT nasal spray, instill 2 sprays into each nostril once daily, Disp: 16 g, Rfl: 12 .  lisinopril (ZESTRIL) 40 MG tablet, TAKE 1 TABLET BY MOUTH EVERY DAY, Disp: 90 tablet, Rfl: 3 .  loratadine (CLARITIN) 10 MG tablet, Take 10 mg by mouth daily. , Disp: , Rfl:  .  vitamin C (ASCORBIC ACID) 500 MG tablet, Take 500 mg by mouth daily., Disp: , Rfl:  .  FLUAD 0.5 ML SUSY, inject 0.5 milliliter intramuscularly, Disp: , Rfl: 0  Review of Systems  Constitutional: Negative.   HENT: Negative.   Eyes: Negative.   Respiratory: Negative.   Cardiovascular: Negative.   Gastrointestinal: Negative.    Endocrine: Negative.   Musculoskeletal: Positive for arthralgias.  Allergic/Immunologic: Negative.   Neurological: Positive for numbness.  Hematological: Negative.   Psychiatric/Behavioral: Negative.     Social History   Tobacco Use  . Smoking status: Never Smoker  . Smokeless tobacco: Never Used  Substance Use Topics  . Alcohol use: Yes    Alcohol/week: 0.0 - 5.0 standard drinks      Objective:   BP 121/76 (BP Location: Left Arm, Patient Position: Sitting, Cuff Size: Large)   Pulse 80   Temp (!) 96.9 F (36.1 C) (Other (Comment))   Resp 18   Ht 6' (1.829 m)   Wt 186 lb (84.4 kg)   SpO2 97%   BMI 25.23 kg/m  Vitals:   12/23/19 0814  BP: 121/76  Pulse: 80  Resp: 18  Temp: (!) 96.9 F (36.1 C)  TempSrc: Other (Comment)  SpO2: 97%  Weight: 186 lb (84.4 kg)  Height: 6' (1.829 m)  Body mass index is 25.23 kg/m.   Physical Exam Vitals reviewed.  Constitutional:      Appearance: He is well-developed.  HENT:     Head: Normocephalic and atraumatic.     Right Ear: External ear normal.     Left Ear: External ear normal.     Nose: Nose normal.  Eyes:     Conjunctiva/sclera: Conjunctivae normal.  Pupils: Pupils are equal, round, and reactive to light.  Cardiovascular:     Rate and Rhythm: Normal rate and regular rhythm.     Heart sounds: Normal heart sounds.  Pulmonary:     Effort: Pulmonary effort is normal.     Breath sounds: Normal breath sounds.  Abdominal:     General: Bowel sounds are normal.     Palpations: Abdomen is soft.  Musculoskeletal:        General: Normal range of motion.     Cervical back: Normal range of motion and neck supple.  Skin:    General: Skin is warm and dry.  Neurological:     Mental Status: He is alert and oriented to person, place, and time.  Psychiatric:        Behavior: Behavior normal.        Thought Content: Thought content normal.        Judgment: Judgment normal.      No results found for any visits on  12/23/19.     Assessment & Plan    1. Encounter for Medicare annual wellness exam  - CBC w/Diff/Platelet - TSH - Lipid panel - Comprehensive Metabolic Panel (CMET) - Hemoglobin A1C  2. Essential (primary) hypertension  - CBC w/Diff/Platelet - TSH - Lipid panel - Comprehensive Metabolic Panel (CMET) - Hemoglobin A1C  3. Hypercholesterolemia  - CBC w/Diff/Platelet - TSH - Lipid panel - Comprehensive Metabolic Panel (CMET) - Hemoglobin A1C  4. Borderline diabetes Patient continues to work on diet and exercise. - CBC w/Diff/Platelet - TSH - Lipid panel - Comprehensive Metabolic Panel (CMET) - Hemoglobin A1C  5. Right hip pain Only pain or tenderness is mild tenderness over the right greater trochanter.  I think he probably has a bursitis here from the fall.  May need PT or Ortho referral in the future. - DG Hip Unilat W OR W/O Pelvis 2-3 Views Right    Follow up in Summer for AWV.    I,Cordarius Benning,acting as a scribe for Wilhemena Durie, MD.,have documented all relevant documentation on the behalf of Wilhemena Durie, MD,as directed by  Wilhemena Durie, MD while in the presence of Wilhemena Durie, MD.     Wilhemena Durie, MD  [employer]

## 2019-12-23 ENCOUNTER — Ambulatory Visit
Admission: RE | Admit: 2019-12-23 | Discharge: 2019-12-23 | Disposition: A | Discharge status: Home / Self Care | Payer: Medicare Other | Source: Ambulatory Visit | Attending: Family Medicine | Admitting: Family Medicine

## 2019-12-23 ENCOUNTER — Ambulatory Visit (INDEPENDENT_AMBULATORY_CARE_PROVIDER_SITE_OTHER): Payer: Medicare Other | Admitting: Family Medicine

## 2019-12-23 ENCOUNTER — Telehealth: Payer: Self-pay

## 2019-12-23 ENCOUNTER — Ambulatory Visit
Admission: RE | Admit: 2019-12-23 | Discharge: 2019-12-23 | Disposition: A | Discharge status: Home / Self Care | Payer: Medicare Other | Attending: Family Medicine | Admitting: Family Medicine

## 2019-12-23 ENCOUNTER — Encounter: Payer: Self-pay | Admitting: Family Medicine

## 2019-12-23 ENCOUNTER — Other Ambulatory Visit: Payer: Self-pay

## 2019-12-23 VITALS — BP 121/76 | HR 80 | Temp 96.9°F | Resp 18 | Ht 72.0 in | Wt 186.0 lb

## 2019-12-23 DIAGNOSIS — E7800 Pure hypercholesterolemia, unspecified: Secondary | ICD-10-CM

## 2019-12-23 DIAGNOSIS — M25551 Pain in right hip: Secondary | ICD-10-CM | POA: Diagnosis not present

## 2019-12-23 DIAGNOSIS — Z0000 Encounter for general adult medical examination without abnormal findings: Secondary | ICD-10-CM

## 2019-12-23 DIAGNOSIS — I10 Essential (primary) hypertension: Secondary | ICD-10-CM | POA: Diagnosis not present

## 2019-12-23 DIAGNOSIS — R7303 Prediabetes: Secondary | ICD-10-CM | POA: Diagnosis not present

## 2019-12-23 NOTE — Telephone Encounter (Signed)
-----   Message from Jerrol Banana., MD sent at 12/23/2019  3:21 PM EDT ----- Normal x-ray.

## 2019-12-23 NOTE — Telephone Encounter (Signed)
Patient advised on x-ray results.

## 2019-12-24 ENCOUNTER — Telehealth: Payer: Self-pay

## 2019-12-24 LAB — COMPREHENSIVE METABOLIC PANEL
ALT: 24 IU/L (ref 0–44)
AST: 24 IU/L (ref 0–40)
Albumin/Globulin Ratio: 2 (ref 1.2–2.2)
Albumin: 4.7 g/dL (ref 3.8–4.8)
Alkaline Phosphatase: 102 IU/L (ref 39–117)
BUN/Creatinine Ratio: 13 (ref 10–24)
BUN: 16 mg/dL (ref 8–27)
Bilirubin Total: 0.7 mg/dL (ref 0.0–1.2)
CO2: 22 mmol/L (ref 20–29)
Calcium: 10 mg/dL (ref 8.6–10.2)
Chloride: 103 mmol/L (ref 96–106)
Creatinine, Ser: 1.23 mg/dL (ref 0.76–1.27)
GFR calc Af Amer: 68 mL/min/{1.73_m2} (ref >59)
GFR calc non Af Amer: 59 mL/min/{1.73_m2} — ABNORMAL LOW (ref >59)
Globulin, Total: 2.4 g/dL (ref 1.5–4.5)
Glucose: 97 mg/dL (ref 65–99)
Potassium: 4.5 mmol/L (ref 3.5–5.2)
Sodium: 139 mmol/L (ref 134–144)
Total Protein: 7.1 g/dL (ref 6.0–8.5)

## 2019-12-24 LAB — CBC WITH DIFFERENTIAL/PLATELET
Basophils Absolute: 0 10*3/uL (ref 0.0–0.2)
Basos: 1 %
EOS (ABSOLUTE): 0.1 10*3/uL (ref 0.0–0.4)
Eos: 1 %
Hematocrit: 49.2 % (ref 37.5–51.0)
Hemoglobin: 16.6 g/dL (ref 13.0–17.7)
Immature Grans (Abs): 0 10*3/uL (ref 0.0–0.1)
Immature Granulocytes: 0 %
Lymphocytes Absolute: 1.6 10*3/uL (ref 0.7–3.1)
Lymphs: 21 %
MCH: 30.9 pg (ref 26.6–33.0)
MCHC: 33.7 g/dL (ref 31.5–35.7)
MCV: 92 fL (ref 79–97)
Monocytes Absolute: 0.5 10*3/uL (ref 0.1–0.9)
Monocytes: 7 %
Neutrophils Absolute: 5.1 10*3/uL (ref 1.4–7.0)
Neutrophils: 70 %
Platelets: 203 10*3/uL (ref 150–450)
RBC: 5.37 x10E6/uL (ref 4.14–5.80)
RDW: 12.7 % (ref 11.6–15.4)
WBC: 7.4 10*3/uL (ref 3.4–10.8)

## 2019-12-24 LAB — HEMOGLOBIN A1C
Est. average glucose Bld gHb Est-mCnc: 131 mg/dL
Hgb A1c MFr Bld: 6.2 % — ABNORMAL HIGH (ref 4.8–5.6)

## 2019-12-24 LAB — TSH: TSH: 3.11 u[IU]/mL (ref 0.450–4.500)

## 2019-12-24 LAB — LIPID PANEL
Chol/HDL Ratio: 2.9 ratio (ref 0.0–5.0)
Cholesterol, Total: 112 mg/dL (ref 100–199)
HDL: 38 mg/dL — ABNORMAL LOW (ref >39)
LDL Chol Calc (NIH): 54 mg/dL (ref 0–99)
Triglycerides: 107 mg/dL (ref 0–149)
VLDL Cholesterol Cal: 20 mg/dL (ref 5–40)

## 2019-12-24 NOTE — Telephone Encounter (Signed)
Patient advised.

## 2019-12-24 NOTE — Telephone Encounter (Signed)
-----   Message from Jerrol Banana., MD sent at 12/24/2019  8:19 AM EDT ----- Labs stable.  Work on diet and exercise for slightly rising blood sugar.

## 2019-12-30 ENCOUNTER — Other Ambulatory Visit: Payer: Self-pay | Admitting: Family Medicine

## 2019-12-30 NOTE — Telephone Encounter (Signed)
Requested Prescriptions  Pending Prescriptions Disp Refills  . atorvastatin (LIPITOR) 40 MG tablet [Pharmacy Med Name: ATORVASTATIN 40 MG TABLET] 90 tablet 3    Sig: TAKE 1 TABLET BY MOUTH EVERY DAY     Cardiovascular:  Antilipid - Statins Failed - 12/30/2019  1:57 AM      Failed - LDL in normal range and within 360 days    LDL Chol Calc (NIH)  Date Value Ref Range Status  12/23/2019 54 0 - 99 mg/dL Final         Failed - HDL in normal range and within 360 days    HDL  Date Value Ref Range Status  12/23/2019 38 (L) >39 mg/dL Final         Failed - Valid encounter within last 12 months    Recent Outpatient Visits          1 week ago Borderline diabetes   [facility] Jerrol Banana., MD   1 year ago Essential (primary) hypertension   [facility] Jerrol Banana., MD   2 years ago Essential (primary) hypertension   [facility] Jerrol Banana., MD   2 years ago Essential (primary) hypertension   [facility] Jerrol Banana., MD   3 years ago Cough   [facility] Jerrol Banana., MD      Future Appointments            In 3 months Jerrol Banana., MD [facility]           Passed - Total Cholesterol in normal range and within 360 days    Cholesterol, Total  Date Value Ref Range Status  12/23/2019 112 100 - 199 mg/dL Final         Passed - Triglycerides in normal range and within 360 days    Triglycerides  Date Value Ref Range Status  12/23/2019 107 0 - 149 mg/dL Final         Passed - Patient is not pregnant      Patient was seen by provider one week ago and had lab work.

## 2020-03-28 ENCOUNTER — Other Ambulatory Visit: Payer: Self-pay | Admitting: Family Medicine

## 2020-03-28 NOTE — Telephone Encounter (Signed)
Requested Prescriptions  Pending Prescriptions Disp Refills  . lisinopril (ZESTRIL) 40 MG tablet [Pharmacy Med Name: LISINOPRIL 40 MG TABLET] 90 tablet 3    Sig: TAKE 1 TABLET BY MOUTH EVERY DAY     Cardiovascular:  ACE Inhibitors Failed - 03/28/2020  1:19 AM      Failed - Valid encounter within last 6 months    Recent Outpatient Visits          3 months ago Borderline diabetes   [facility] Jerrol Banana., MD   2 years ago Essential (primary) hypertension   [facility] Jerrol Banana., MD   2 years ago Essential (primary) hypertension   [facility] Jerrol Banana., MD   3 years ago Essential (primary) hypertension   [facility] Jerrol Banana., MD   3 years ago Cough   [facility] Jerrol Banana., MD      Future Appointments            In 4 weeks Jerrol Banana., MD [facility]           Passed - Cr in normal range and within 180 days    Creatinine, Ser  Date Value Ref Range Status  12/23/2019 1.23 0.76 - 1.27 mg/dL Final         Passed - K in normal range and within 180 days    Potassium  Date Value Ref Range Status  12/23/2019 4.5 3.5 - 5.2 mmol/L Final         Passed - Patient is not pregnant      Passed - Last BP in normal range    BP Readings from Last 1 Encounters:  12/23/19 121/76

## 2020-04-21 ENCOUNTER — Other Ambulatory Visit: Payer: Self-pay | Admitting: Family Medicine

## 2020-04-21 NOTE — Telephone Encounter (Signed)
Requested medication (s) are due for refill today: no  Requested medication (s) are on the active medication list: yes  Last refill: 03/28/2020  Future visit scheduled: yes  Notes to clinic:  Patient has appointment on 04/27/2020 Patient was given courtesy refill until appointment    Requested Prescriptions  Pending Prescriptions Disp Refills   lisinopril (ZESTRIL) 40 MG tablet [Pharmacy Med Name: LISINOPRIL 40 MG TABLET] 30 tablet 0    Sig: TAKE 1 TABLET BY MOUTH EVERY DAY      Cardiovascular:  ACE Inhibitors Failed - 04/21/2020 11:35 AM      Failed - Valid encounter within last 6 months    Recent Outpatient Visits           4 months ago Borderline diabetes   [facility] Jerrol Banana., MD   2 years ago Essential (primary) hypertension   [facility] Jerrol Banana., MD   2 years ago Essential (primary) hypertension   [facility] Jerrol Banana., MD   3 years ago Essential (primary) hypertension   [facility] Jerrol Banana., MD   3 years ago Cough   [facility] Jerrol Banana., MD       Future Appointments             In 5 days Jerrol Banana., MD [facility]            Passed - Cr in normal range and within 180 days    Creatinine, Ser  Date Value Ref Range Status  12/23/2019 1.23 0.76 - 1.27 mg/dL Final          Passed - K in normal range and within 180 days    Potassium  Date Value Ref Range Status  12/23/2019 4.5 3.5 - 5.2 mmol/L Final          Passed - Patient is not pregnant      Passed - Last BP in normal range    BP Readings from Last 1 Encounters:  12/23/19 121/76

## 2020-04-22 NOTE — Progress Notes (Signed)
Established patient visit  I,Jack Lutz,acting as a scribe for Jack Durie, MD.,have documented all relevant documentation on the behalf of Jack Durie, MD,as directed by  Jack Durie, MD while in the presence of Jack Durie, MD.   Patient: Jack Lutz   DOB: 11-25-1950   68 y.o. Male  MRN: [id number] Visit Date: 04/26/2020  Today's healthcare provider: Wilhemena Durie, MD   Chief Complaint  Patient presents with  . Follow-up  . Hypertension   Subjective    HPI  Patient is doing well with diet and feels well.  He has lost 10 pounds by eliminating sugar since his last visit. Hypertension, follow-up  BP Readings from Last 3 Encounters:  04/26/20 129/72  12/23/19 121/76  03/26/18 130/68   Wt Readings from Last 3 Encounters:  04/26/20 176 lb (79.8 kg)  12/23/19 186 lb (84.4 kg)  03/26/18 184 lb 3.2 oz (83.6 kg)     He was last seen for hypertension 4 months ago.  BP at that visit was 121/76. Management since that visit includes; controlled. He reports good compliance with treatment. He is not having side effects. none He is exercising. He is adherent to low salt diet.   Outside blood pressures are normal.  He does not smoke.  Use of agents associated with hypertension: none.   He does have 2 other issues.  He wonders about if he needs a referral to Dr. Vickki Lutz if the bone spurs in his right foot causing more pain.  He also has some scarring in his right hand and left hand that he just noticed this year. --------------------------------------------------------------------  Borderline diabetes From 12/23/2019-Patient continues to work on diet and exercise. Hemoglobin A1C-6.2.      Medications: Outpatient Medications Prior to Visit  Medication Sig  . aspirin 81 MG tablet Take 81 mg by mouth daily.   Marland Kitchen atorvastatin (LIPITOR) 40 MG tablet TAKE 1 TABLET BY MOUTH EVERY DAY  . Cholecalciferol (VITAMIN D) 2000 UNITS tablet Take 2,000  Units by mouth daily.   Marland Kitchen CINNAMON PO Take 1,000 mg by mouth daily.   . Coenzyme Q10 (CO Q-10) 100 MG CAPS Take 200 mg by mouth daily.   Marland Kitchen FLUAD 0.5 ML SUSY inject 0.5 milliliter intramuscularly  . fluticasone (FLONASE) 50 MCG/ACT nasal spray instill 2 sprays into each nostril once daily  . lisinopril (ZESTRIL) 40 MG tablet TAKE 1 TABLET BY MOUTH EVERY DAY  . loratadine (CLARITIN) 10 MG tablet Take 10 mg by mouth daily.   . vitamin C (ASCORBIC ACID) 500 MG tablet Take 500 mg by mouth daily.   No facility-administered medications prior to visit.    Review of Systems  Constitutional: Negative.   HENT: Negative.   Eyes: Negative.   Respiratory: Negative.   Cardiovascular: Negative.   Gastrointestinal: Negative.   Musculoskeletal:       Right foot pain on occasion and Dupuytren's contracture both hands  Allergic/Immunologic: Negative.   Hematological: Negative.   Psychiatric/Behavioral: Negative.     Last hemoglobin A1c Lab Results  Component Value Date   HGBA1C 5.6 04/26/2020      Objective    BP 129/72 (BP Location: Left Arm, Patient Position: Sitting, Cuff Size: Large)   Pulse (!) 58   Temp (!) 97.1 F (36.2 C) (Other (Comment))   Resp 16   Ht 6' (1.829 m)   Wt 176 lb (79.8 kg)   SpO2 98%   BMI 23.87 kg/m  Wt  Readings from Last 3 Encounters:  04/26/20 176 lb (79.8 kg)  12/23/19 186 lb (84.4 kg)  03/26/18 184 lb 3.2 oz (83.6 kg)      Physical Exam Vitals reviewed.  Constitutional:      Appearance: He is well-developed.  HENT:     Head: Normocephalic and atraumatic.     Right Ear: External ear normal.     Left Ear: External ear normal.     Nose: Nose normal.  Eyes:     Conjunctiva/sclera: Conjunctivae normal.     Pupils: Pupils are equal, round, and reactive to light.  Cardiovascular:     Rate and Rhythm: Normal rate and regular rhythm.     Heart sounds: Normal heart sounds.  Pulmonary:     Effort: Pulmonary effort is normal.     Breath sounds: Normal  breath sounds.  Abdominal:     General: Bowel sounds are normal.     Palpations: Abdomen is soft.  Musculoskeletal:     Cervical back: Normal range of motion and neck supple.     Comments: Early Dupuytren Dupuytren's contracture of both hands  Skin:    General: Skin is warm and dry.  Neurological:     General: No focal deficit present.     Mental Status: He is alert and oriented to person, place, and time.  Psychiatric:        Mood and Affect: Mood normal.        Behavior: Behavior normal.        Thought Content: Thought content normal.        Judgment: Judgment normal.     BP 129/72 (BP Location: Left Arm, Patient Position: Sitting, Cuff Size: Large)   Pulse (!) 58   Temp (!) 97.1 F (36.2 C) (Other (Comment))   Resp 16   Ht 6' (1.829 m)   Wt 176 lb (79.8 kg)   SpO2 98%   BMI 23.87 kg/m   General Appearance:    Alert, cooperative, no distress, appears stated age  Head:    Normocephalic, without obvious abnormality, atraumatic  Eyes:    PERRL, conjunctiva/corneas clear, EOM's intact, fundi    benign, both eyes       Ears:    Normal TM's and external ear canals, both ears  Nose:   Nares normal, septum midline, mucosa normal, no drainage   or sinus tenderness  Throat:   Lips, mucosa, and tongue normal; teeth and gums normal  Neck:   Supple, symmetrical, trachea midline, no adenopathy;       thyroid:  No enlargement/tenderness/nodules; no carotid   bruit or JVD  Back:     Symmetric, no curvature, ROM normal, no CVA tenderness  Lungs:     Clear to auscultation bilaterally, respirations unlabored  Chest wall:    No tenderness or deformity  Heart:    Regular rate and rhythm, S1 and S2 normal, no murmur, rub   or gallop  Abdomen:     Soft, non-tender, bowel sounds active all four quadrants,    no masses, no organomegaly  Genitalia:    Normal male without lesion, discharge or tenderness  Rectal:    Normal tone, normal prostate, no masses or tenderness;   guaiac negative  stool  Extremities:   Extremities normal, atraumatic, no cyanosis or edema  Pulses:   2+ and symmetric all extremities  Skin:   Skin color, texture, turgor normal, no rashes or lesions  Lymph nodes:   Cervical, supraclavicular, and axillary nodes  normal  Neurologic:   CNII-XII intact. Normal strength, sensation and reflexes      throughout     Results for orders placed or performed in visit on 04/26/20  POCT glycosylated hemoglobin (Hb A1C)  Result Value Ref Range   Hemoglobin A1C 5.6 4.0 - 5.6 %   Est. average glucose Bld gHb Est-mCnc 114     Assessment & Plan     1. Essential (primary) hypertension Very good control.  2. Borderline diabetes Down from 6.2 on last visit.  This is certainly due to dietary changes and weight loss.  I will see him back for a physical on next visit - POCT glycosylated hemoglobin (Hb A1C)-5.6 today.  3. Avitaminosis D   4. Hypercholesterolemia   5. Chronic pain in right foot Patient has seen Dr. Vickki Lutz in the past.  Referred him of this persist but it is resolved for now  6. Dupuytren's contracture of both hands Bilateral Dupuytren's contracture of the ring finger both hands.  Will refer to any point time it bothers the patient.  He still has full extension of these fingers.   Return in 5 months (on 10/05/2020) for CPE.      I, Jack Durie, MD, have reviewed all documentation for this visit. The documentation on 04/26/20 for the exam, diagnosis, procedures, and orders are all accurate and complete.    Ainsleigh Kakos Cranford Mon, MD  [facility] [phone] (phone) [phone] (fax)  [place]

## 2020-04-26 ENCOUNTER — Encounter: Payer: Self-pay | Admitting: Family Medicine

## 2020-04-26 ENCOUNTER — Ambulatory Visit (INDEPENDENT_AMBULATORY_CARE_PROVIDER_SITE_OTHER): Payer: Medicare Other | Admitting: Family Medicine

## 2020-04-26 ENCOUNTER — Other Ambulatory Visit: Payer: Self-pay

## 2020-04-26 VITALS — BP 129/72 | HR 58 | Temp 97.1°F | Resp 16 | Ht 72.0 in | Wt 176.0 lb

## 2020-04-26 DIAGNOSIS — G8929 Other chronic pain: Secondary | ICD-10-CM | POA: Diagnosis not present

## 2020-04-26 DIAGNOSIS — I10 Essential (primary) hypertension: Secondary | ICD-10-CM

## 2020-04-26 DIAGNOSIS — M79671 Pain in right foot: Secondary | ICD-10-CM

## 2020-04-26 DIAGNOSIS — M720 Palmar fascial fibromatosis [Dupuytren]: Secondary | ICD-10-CM

## 2020-04-26 DIAGNOSIS — E559 Vitamin D deficiency, unspecified: Secondary | ICD-10-CM

## 2020-04-26 DIAGNOSIS — E7800 Pure hypercholesterolemia, unspecified: Secondary | ICD-10-CM

## 2020-04-26 DIAGNOSIS — R7303 Prediabetes: Secondary | ICD-10-CM

## 2020-04-26 LAB — POCT GLYCOSYLATED HEMOGLOBIN (HGB A1C)
Est. average glucose Bld gHb Est-mCnc: 114
Hemoglobin A1C: 5.6 % (ref 4.0–5.6)

## 2020-05-17 ENCOUNTER — Other Ambulatory Visit: Payer: Self-pay | Admitting: Family Medicine

## 2020-06-14 ENCOUNTER — Other Ambulatory Visit: Payer: Self-pay | Admitting: Family Medicine

## 2020-06-14 DIAGNOSIS — I10 Essential (primary) hypertension: Secondary | ICD-10-CM

## 2020-07-13 DIAGNOSIS — Z23 Encounter for immunization: Secondary | ICD-10-CM | POA: Diagnosis not present

## 2020-07-29 DIAGNOSIS — Z23 Encounter for immunization: Secondary | ICD-10-CM | POA: Diagnosis not present

## 2020-09-14 ENCOUNTER — Other Ambulatory Visit: Payer: Self-pay | Admitting: Family Medicine

## 2020-09-14 DIAGNOSIS — I10 Essential (primary) hypertension: Secondary | ICD-10-CM

## 2020-10-05 ENCOUNTER — Encounter: Payer: Self-pay | Admitting: Family Medicine

## 2020-12-13 ENCOUNTER — Telehealth: Payer: Self-pay | Admitting: Family Medicine

## 2020-12-13 NOTE — Telephone Encounter (Signed)
Copied from [place] [id number]. Topic: Medicare AWV >> Dec 13, 2020 11:33 AM Cher Nakai R wrote: Reason for CRM:   Left message re: cancelled AWVS scheduled on 3/18 due to [facility] not working on Fridays and rescheduled to 3/14 at 9:40-srs

## 2020-12-15 NOTE — Progress Notes (Signed)
Subjective:   Jack Lutz is a 70 y.o. male who presents for Medicare Annual/Subsequent preventive examination.  I connected with Kasandra Knudsen today by telephone and verified that I am speaking with the correct person using two identifiers. Location patient: home Location provider: work Persons participating in the virtual visit: patient, provider.   I discussed the limitations, risks, security and privacy concerns of performing an evaluation and management service by telephone and the availability of in person appointments. I also discussed with the patient that there may be a patient responsible charge related to this service. The patient expressed understanding and verbally consented to this telephonic visit.    Interactive audio and video telecommunications were attempted between this provider and patient, however failed, due to patient having technical difficulties OR patient did not have access to video capability.  We continued and completed visit with audio only.   Review of Systems    N/A  Cardiac Risk Factors include: advanced age (>11men, >21 women);male gender;dyslipidemia;hypertension     Objective:    There were no vitals filed for this visit. There is no height or weight on file to calculate BMI.  Advanced Directives 12/19/2020 03/25/2018 05/15/2017 03/13/2017 05/03/2016 08/23/2015  Does Patient Have a Medical Advance Directive? Yes Yes Yes Yes Yes Yes  Type of Paramedic of [place];Living will Living will Living will [place];Living will Living will;Healthcare Power of Attorney -  Copy of [place] in Chart? Yes - validated most recent copy scanned in chart (See row information) - - No - copy requested No - copy requested -    Current Medications (verified) Outpatient Encounter Medications as of 12/19/2020  Medication Sig  . aspirin 81 MG tablet Take 81 mg by mouth daily.   Marland Kitchen atorvastatin (LIPITOR) 40 MG  tablet TAKE 1 TABLET BY MOUTH EVERY DAY  . Cholecalciferol (VITAMIN D) 2000 UNITS tablet Take 2,000 Units by mouth daily.   Marland Kitchen CINNAMON PO Take 1,000 mg by mouth daily.   . Coenzyme Q10 (CO Q-10) 100 MG CAPS Take 200 mg by mouth daily.   . fluticasone (FLONASE) 50 MCG/ACT nasal spray instill 2 sprays into each nostril once daily  . lisinopril (ZESTRIL) 40 MG tablet TAKE 1 TABLET BY MOUTH EVERY DAY  . loratadine (CLARITIN) 10 MG tablet Take 10 mg by mouth daily.   . vitamin C (ASCORBIC ACID) 500 MG tablet Take 500 mg by mouth daily.  Marland Kitchen FLUAD 0.5 ML SUSY inject 0.5 milliliter intramuscularly (Patient not taking: Reported on 12/19/2020)   No facility-administered encounter medications on file as of 12/19/2020.    Allergies (verified) Patient has no known allergies.   History: Past Medical History:  Diagnosis Date  . Allergy   . Hyperlipidemia   . Hypertension    Past Surgical History:  Procedure Laterality Date  . CHOLECYSTECTOMY  07/05/1996  . COLONOSCOPY WITH PROPOFOL N/A 05/15/2017   Procedure: COLONOSCOPY WITH PROPOFOL;  Surgeon: Robert Bellow, MD;  Location: [facility] ENDOSCOPY;  Service: Endoscopy;  Laterality: N/A;   Family History  Problem Relation Age of Onset  . Heart attack Father   . Heart attack Brother   . Other Brother        brain tumor  . Heart attack Brother        heart attack x 2  . Heart disease Mother   . Emphysema Mother   . Heart attack Sister   . Heart disease Sister  Social History   Socioeconomic History  . Marital status: Married    Spouse name: Not on file  . Number of children: 1  . Years of education: Not on file  . Highest education level: Bachelor's degree (e.g., BA, AB, BS)  Occupational History  . Occupation: retired  Tobacco Use  . Smoking status: Never Smoker  . Smokeless tobacco: Never Used  Vaping Use  . Vaping Use: Never used  Substance and Sexual Activity  . Alcohol use: Yes    Alcohol/week: 0.0 - 5.0 standard drinks  .  Drug use: No  . Sexual activity: Not on file  Other Topics Concern  . Not on file  Social History Narrative  . Not on file   Social Determinants of Health   Financial Resource Strain: Low Risk   . Difficulty of Paying Living Expenses: Not hard at all  Food Insecurity: No Food Insecurity  . Worried About Charity fundraiser in the Last Year: Never true  . Ran Out of Food in the Last Year: Never true  Transportation Needs: No Transportation Needs  . Lack of Transportation (Medical): No  . Lack of Transportation (Non-Medical): No  Physical Activity: Sufficiently Active  . Days of Exercise per Week: 7 days  . Minutes of Exercise per Session: 30 min  Stress: No Stress Concern Present  . Feeling of Stress : Not at all  Social Connections: Moderately Integrated  . Frequency of Communication with Friends and Family: More than three times a week  . Frequency of Social Gatherings with Friends and Family: More than three times a week  . Attends Religious Services: More than 4 times per year  . Active Member of Clubs or Organizations: No  . Attends Archivist Meetings: Never  . Marital Status: Married    Tobacco Counseling Counseling given: Not Answered   Clinical Intake:  Pre-visit preparation completed: Yes  Pain : No/denies pain     Nutritional Risks: None Diabetes:  (Prediabetic)  How often do you need to have someone help you when you read instructions, pamphlets, or other written materials from your doctor or pharmacy?: 1 - Never  Diabetic? No  Interpreter Needed?: No  Information entered by :: [facility], LPN   Activities of Daily Living In your present state of health, do you have any difficulty performing the following activities: 12/19/2020  Hearing? N  Vision? N  Difficulty concentrating or making decisions? N  Walking or climbing stairs? N  Dressing or bathing? N  Doing errands, shopping? N  Preparing Food and eating ? N  Using the Toilet? N   In the past six months, have you accidently leaked urine? N  Do you have problems with loss of bowel control? N  Managing your Medications? N  Managing your Finances? Y  Comment Wife manages finances.  Housekeeping or managing your Housekeeping? N  Some recent data might be hidden    Patient Care Team: Jerrol Banana., MD as PCP - General (Family Medicine) Thelma Comp, [place] as Consulting Physician (Optometry) Bary Castilla, Forest Gleason, MD as Consulting Physician (General Surgery) Ralene Bathe, MD (Dermatology)  Indicate any recent Medical Services you may have received from other than [facility] providers in the past year (date may be approximate).     Assessment:   This is a routine wellness examination for [place].  Hearing/Vision screen No exam data present  Dietary issues and exercise activities discussed: Current Exercise Habits: Home exercise routine, Type of exercise: walking,  Time (Minutes): 30, Frequency (Times/Week): 7, Weekly Exercise (Minutes/Week): 210, Intensity: Mild, Exercise limited by: None identified  Goals    . Increase water intake     Recommend increasing water intake to 6-8 8 oz glasses a day.      Depression Screen PHQ 2/9 Scores 12/19/2020 09/29/2019 09/29/2019 03/25/2018 03/13/2017 03/13/2017 02/26/2017  PHQ - 2 Score 0 0 0 0 0 0 0  PHQ- 9 Score - - - - 0 - -    Fall Risk Fall Risk  12/19/2020 09/29/2019 05/07/2019 03/25/2018 03/13/2017  Falls in the past year? 0 1 0 No No  Comment - - Emmi Telephone Survey: data to providers prior to load - -  Number falls in past yr: 0 0 - - -  Injury with Fall? 0 0 - - -  Follow up - Falls prevention discussed - - -    FALL RISK PREVENTION PERTAINING TO THE HOME:  Any stairs in or around the home? Yes  If so, are there any without handrails? No  Home free of loose throw rugs in walkways, pet beds, electrical cords, etc? Yes  Adequate lighting in your home to reduce risk of falls? Yes   ASSISTIVE DEVICES  UTILIZED TO PREVENT FALLS:  Life alert? No  Use of a cane, walker or w/c? No  Grab bars in the bathroom? No  Shower chair or bench in shower? No  Elevated toilet seat or a handicapped toilet? No    Cognitive Function: Normal cognitive status assessed by observation by this Nurse Health Advisor. No abnormalities found.       6CIT Screen 09/29/2019 03/13/2017  What Year? 0 points 0 points  What month? 0 points 0 points  What time? 0 points 0 points  Count back from 20 0 points 0 points  Months in reverse 0 points 0 points  Repeat phrase 0 points 0 points  Total Score 0 0    Immunizations Immunization History  Administered Date(s) Administered  . Influenza Whole 07/11/2017  . Influenza, High Dose Seasonal PF 08/19/2018, 07/01/2019  . Moderna Sars-Covid-2 Vaccination 11/06/2019, 12/04/2019, 07/29/2020  . Pneumococcal Conjugate-13 01/18/2015  . Pneumococcal Polysaccharide-23 02/21/2016  . Tdap 10/30/2010  . Zoster 10/30/2010  . Zoster Recombinat (Shingrix) 07/18/2017, 09/25/2017    TDAP status: Due, Education has been provided regarding the importance of this vaccine. Advised may receive this vaccine at local pharmacy or Health Dept. Aware to provide a copy of the vaccination record if obtained from local pharmacy or Health Dept. Verbalized acceptance and understanding.  Flu Vaccine status: Up to date  Pneumococcal vaccine status: Up to date  Covid-19 vaccine status: Completed vaccines  Qualifies for Shingles Vaccine? Yes   Zostavax completed Yes   Shingrix Completed?: Yes  Screening Tests Health Maintenance  Topic Date Due  . TETANUS/TDAP  12/19/2021 (Originally 10/30/2020)  . COVID-19 Vaccine (4 - Booster for Moderna series) 01/27/2021  . COLONOSCOPY (Pts 45-32yrs Insurance coverage will need to be confirmed)  05/16/2027  . INFLUENZA VACCINE  Completed  . Hepatitis C Screening  Completed  . PNA vac Low Risk Adult  Completed  . HPV VACCINES  Aged Out    Health  Maintenance  There are no preventive care reminders to display for this patient.  Colorectal cancer screening: Type of screening: Colonoscopy. Completed 05/24/17. Repeat every 10 years  Lung Cancer Screening: (Low Dose CT Chest recommended if Age 87-80 years, 30 pack-year currently smoking OR have quit w/in 15years.) does not qualify.  Additional Screening:  Hepatitis C Screening: Up to date  Vision Screening: Recommended annual ophthalmology exams for early detection of glaucoma and other disorders of the eye. Is the patient up to date with their annual eye exam?  Yes  Who is the provider or what is the name of the office in which the patient attends annual eye exams? Dr Rick Duff If pt is not established with a provider, would they like to be referred to a provider to establish care? No .   Dental Screening: Recommended annual dental exams for proper oral hygiene  Community Resource Referral / Chronic Care Management: CRR required this visit?  No   CCM required this visit?  No      Plan:     I have personally reviewed and noted the following in the patient's chart:   . Medical and social history . Use of alcohol, tobacco or illicit drugs  . Current medications and supplements . Functional ability and status . Nutritional status . Physical activity . Advanced directives . List of other physicians . Hospitalizations, surgeries, and ER visits in previous 12 months . Vitals . Screenings to include cognitive, depression, and falls . Referrals and appointments  In addition, I have reviewed and discussed with patient certain preventive protocols, quality metrics, and best practice recommendations. A written personalized care plan for preventive services as well as general preventive health recommendations were provided to patient.     McKenzie [place], [place]   [id number]/[id number]   Nurse Notes: None.

## 2020-12-19 ENCOUNTER — Ambulatory Visit (INDEPENDENT_AMBULATORY_CARE_PROVIDER_SITE_OTHER): Payer: Medicare Other

## 2020-12-19 ENCOUNTER — Other Ambulatory Visit: Payer: Self-pay

## 2020-12-19 DIAGNOSIS — Z0000 Encounter for general adult medical examination without abnormal findings: Secondary | ICD-10-CM

## 2020-12-19 NOTE — Patient Instructions (Signed)
Jack Lutz , Thank you for taking time to come for your Medicare Wellness Visit. I appreciate your ongoing commitment to your health goals. Please review the following plan we discussed and let me know if I can assist you in the future.   Screening recommendations/referrals: Colonoscopy: Up to date, due 05/2027 Recommended yearly ophthalmology/optometry visit for glaucoma screening and checkup Recommended yearly dental visit for hygiene and checkup  Vaccinations: Influenza vaccine: Done 07/13/20 Pneumococcal vaccine: Completed series Tdap vaccine: Currently due, declined receiving.  Shingles vaccine: Completed series    Advanced directives: Currently on file.  Conditions/risks identified: Recommend increasing water intake to 6-8 8 oz glasses a day.  Next appointment: 12/26/20 @ 8:00 AM with Dr Rosanna Randy   Preventive Care 34 Years and Older, Male Preventive care refers to lifestyle choices and visits with your health care provider that can promote health and wellness. What does preventive care include?  A yearly physical exam. This is also called an annual well check.  Dental exams once or twice a year.  Routine eye exams. Ask your health care provider how often you should have your eyes checked.  Personal lifestyle choices, including:  Daily care of your teeth and gums.  Regular physical activity.  Eating a healthy diet.  Avoiding tobacco and drug use.  Limiting alcohol use.  Practicing safe sex.  Taking low doses of aspirin every day.  Taking vitamin and mineral supplements as recommended by your health care provider. What happens during an annual well check? The services and screenings done by your health care provider during your annual well check will depend on your age, overall health, lifestyle risk factors, and family history of disease. Counseling  Your health care provider may ask you questions about your:  Alcohol use.  Tobacco use.  Drug use.  Emotional  well-being.  Home and relationship well-being.  Sexual activity.  Eating habits.  History of falls.  Memory and ability to understand (cognition).  Work and work Statistician. Screening  You may have the following tests or measurements:  Height, weight, and BMI.  Blood pressure.  Lipid and cholesterol levels. These may be checked every 5 years, or more frequently if you are over 21 years old.  Skin check.  Lung cancer screening. You may have this screening every year starting at age 40 if you have a 30-pack-year history of smoking and currently smoke or have quit within the past 15 years.  Fecal occult blood test (FOBT) of the stool. You may have this test every year starting at age 59.  Flexible sigmoidoscopy or colonoscopy. You may have a sigmoidoscopy every 5 years or a colonoscopy every 10 years starting at age 31.  Prostate cancer screening. Recommendations will vary depending on your family history and other risks.  Hepatitis C blood test.  Hepatitis B blood test.  Sexually transmitted disease (STD) testing.  Diabetes screening. This is done by checking your blood sugar (glucose) after you have not eaten for a while (fasting). You may have this done every 1-3 years.  Abdominal aortic aneurysm (AAA) screening. You may need this if you are a current or former smoker.  Osteoporosis. You may be screened starting at age 83 if you are at high risk. Talk with your health care provider about your test results, treatment options, and if necessary, the need for more tests. Vaccines  Your health care provider may recommend certain vaccines, such as:  Influenza vaccine. This is recommended every year.  Tetanus, diphtheria, and acellular pertussis (  Tdap, Td) vaccine. You may need a Td booster every 10 years.  Zoster vaccine. You may need this after age 68.  Pneumococcal 13-valent conjugate (PCV13) vaccine. One dose is recommended after age 13.  Pneumococcal  polysaccharide (PPSV23) vaccine. One dose is recommended after age 44. Talk to your health care provider about which screenings and vaccines you need and how often you need them. This information is not intended to replace advice given to you by your health care provider. Make sure you discuss any questions you have with your health care provider. Document Released: 10/21/2015 Document Revised: 06/13/2016 Document Reviewed: 07/26/2015 Elsevier Interactive Patient Education  2017 [employer] Prevention in the Home Falls can cause injuries. They can happen to people of all ages. There are many things you can do to make your home safe and to help prevent falls. What can I do on the outside of my home?  Regularly fix the edges of walkways and driveways and fix any cracks.  Remove anything that might make you trip as you walk through a door, such as a raised step or threshold.  Trim any bushes or trees on the path to your home.  Use bright outdoor lighting.  Clear any walking paths of anything that might make someone trip, such as rocks or tools.  Regularly check to see if handrails are loose or broken. Make sure that both sides of any steps have handrails.  Any raised decks and porches should have guardrails on the edges.  Have any leaves, snow, or ice cleared regularly.  Use sand or salt on walking paths during winter.  Clean up any spills in your garage right away. This includes oil or grease spills. What can I do in the bathroom?  Use night lights.  Install grab bars by the toilet and in the tub and shower. Do not use towel bars as grab bars.  Use non-skid mats or decals in the tub or shower.  If you need to sit down in the shower, use a plastic, non-slip stool.  Keep the floor dry. Clean up any water that spills on the floor as soon as it happens.  Remove soap buildup in the tub or shower regularly.  Attach bath mats securely with double-sided non-slip rug  tape.  Do not have throw rugs and other things on the floor that can make you trip. What can I do in the bedroom?  Use night lights.  Make sure that you have a light by your bed that is easy to reach.  Do not use any sheets or blankets that are too big for your bed. They should not hang down onto the floor.  Have a firm chair that has side arms. You can use this for support while you get dressed.  Do not have throw rugs and other things on the floor that can make you trip. What can I do in the kitchen?  Clean up any spills right away.  Avoid walking on wet floors.  Keep items that you use a lot in easy-to-reach places.  If you need to reach something above you, use a strong step stool that has a grab bar.  Keep electrical cords out of the way.  Do not use floor polish or wax that makes floors slippery. If you must use wax, use non-skid floor wax.  Do not have throw rugs and other things on the floor that can make you trip. What can I do with my stairs?  Do  not leave any items on the stairs.  Make sure that there are handrails on both sides of the stairs and use them. Fix handrails that are broken or loose. Make sure that handrails are as long as the stairways.  Check any carpeting to make sure that it is firmly attached to the stairs. Fix any carpet that is loose or worn.  Avoid having throw rugs at the top or bottom of the stairs. If you do have throw rugs, attach them to the floor with carpet tape.  Make sure that you have a light switch at the top of the stairs and the bottom of the stairs. If you do not have them, ask someone to add them for you. What else can I do to help prevent falls?  Wear shoes that:  Do not have high heels.  Have rubber bottoms.  Are comfortable and fit you well.  Are closed at the toe. Do not wear sandals.  If you use a stepladder:  Make sure that it is fully opened. Do not climb a closed stepladder.  Make sure that both sides of the  stepladder are locked into place.  Ask someone to hold it for you, if possible.  Clearly mark and make sure that you can see:  Any grab bars or handrails.  First and last steps.  Where the edge of each step is.  Use tools that help you move around (mobility aids) if they are needed. These include:  Canes.  Walkers.  Scooters.  Crutches.  Turn on the lights when you go into a dark area. Replace any light bulbs as soon as they burn out.  Set up your furniture so you have a clear path. Avoid moving your furniture around.  If any of your floors are uneven, fix them.  If there are any pets around you, be aware of where they are.  Review your medicines with your doctor. Some medicines can make you feel dizzy. This can increase your chance of falling. Ask your doctor what other things that you can do to help prevent falls. This information is not intended to replace advice given to you by your health care provider. Make sure you discuss any questions you have with your health care provider. Document Released: 07/21/2009 Document Revised: 03/01/2016 Document Reviewed: 10/29/2014 Elsevier Interactive Patient Education  2017 [employer].

## 2020-12-23 ENCOUNTER — Other Ambulatory Visit: Payer: Self-pay | Admitting: Family Medicine

## 2020-12-26 ENCOUNTER — Encounter: Payer: Self-pay | Admitting: Family Medicine

## 2020-12-26 ENCOUNTER — Ambulatory Visit (INDEPENDENT_AMBULATORY_CARE_PROVIDER_SITE_OTHER): Payer: Medicare Other | Admitting: Family Medicine

## 2020-12-26 ENCOUNTER — Other Ambulatory Visit: Payer: Self-pay

## 2020-12-26 VITALS — BP 116/64 | HR 51 | Temp 98.1°F | Resp 16 | Ht 68.0 in | Wt 170.0 lb

## 2020-12-26 DIAGNOSIS — R7303 Prediabetes: Secondary | ICD-10-CM | POA: Diagnosis not present

## 2020-12-26 DIAGNOSIS — I10 Essential (primary) hypertension: Secondary | ICD-10-CM

## 2020-12-26 DIAGNOSIS — Z1389 Encounter for screening for other disorder: Secondary | ICD-10-CM | POA: Diagnosis not present

## 2020-12-26 DIAGNOSIS — E7800 Pure hypercholesterolemia, unspecified: Secondary | ICD-10-CM | POA: Diagnosis not present

## 2020-12-26 LAB — POCT URINALYSIS DIPSTICK
Bilirubin, UA: NEGATIVE
Blood, UA: NEGATIVE
Glucose, UA: NEGATIVE
Ketones, UA: NEGATIVE
Leukocytes, UA: NEGATIVE
Nitrite, UA: NEGATIVE
Protein, UA: NEGATIVE
Spec Grav, UA: 1.01 (ref 1.010–1.025)
Urobilinogen, UA: 0.2 E.U./dL
pH, UA: 6 (ref 5.0–8.0)

## 2020-12-26 NOTE — Progress Notes (Signed)
Established patient visit   Patient: Jack Lutz   DOB: 04-16-1951   70 y.o. Male  MRN: [id number] Visit Date: 12/26/2020  Today's healthcare provider: Wilhemena Durie, MD   Chief Complaint  Patient presents with  . f/u to [facility]   . Hypertension  . Hyperlipidemia   Subjective    HPI  Patient is doing well.  He has done well with weight loss in the past year by cutting out sweets .  He feels well and has no complaints. He has had COVID shots and booster and has had the flu vaccine. Hypertension, follow-up  BP Readings from Last 3 Encounters:  12/26/20 116/64  04/26/20 129/72  12/23/19 121/76   Wt Readings from Last 3 Encounters:  12/26/20 170 lb (77.1 kg)  04/26/20 176 lb (79.8 kg)  12/23/19 186 lb (84.4 kg)     He was last seen for hypertension 8 months ago.  BP at that visit was 129/72. Management since that visit includes; Very good control. He reports good compliance with treatment. He is not having side effects.  He is exercising. He is adherent to low salt diet.   Outside blood pressures are checked occasionally.  He does not smoke.  Use of agents associated with hypertension: none.   Lipid/Cholesterol, follow-up  Last Lipid Panel: Lab Results  Component Value Date   CHOL 112 12/23/2019   LDLCALC 54 12/23/2019   HDL 38 (L) 12/23/2019   TRIG 107 12/23/2019    He was last seen for this 1 years ago.  Management since that visit includes; on atorvastatin.  He reports good compliance with treatment. He is not having side effects.  He is following a Regular diet. Current exercise: no regular exercise  Last metabolic panel Lab Results  Component Value Date   GLUCOSE 97 12/23/2019   NA 139 12/23/2019   K 4.5 12/23/2019   BUN 16 12/23/2019   CREATININE 1.23 12/23/2019   GFRNONAA 59 (L) 12/23/2019   GFRAA 68 12/23/2019   CALCIUM 10.0 12/23/2019   AST 24 12/23/2019   ALT 24 12/23/2019   The ASCVD Risk score (Goff DC Jr., et al., 2013)  failed to calculate for the following reasons:   The valid total cholesterol range is 130 to 320 mg/dL   Prediabetes, Follow-up  Lab Results  Component Value Date   HGBA1C 5.6 04/26/2020   HGBA1C 6.2 (H) 12/23/2019   HGBA1C 5.8 (H) 03/26/2018   GLUCOSE 97 12/23/2019   GLUCOSE 99 03/26/2018   GLUCOSE 104 (H) 02/26/2017    Last seen for for this8 months ago.  Management since that visit includes; Down from 6.2 on last visit.  This is certainly due to dietary changes and weight loss.  I will see him back for a physical on next visit Current symptoms include none and have been stable.  Prior visit with dietician: no Current diet: well balanced Current exercise: no regular exercise  Pertinent Labs:    Component Value Date/Time   CHOL 112 12/23/2019 0859   TRIG 107 12/23/2019 0859   CHOLHDL 2.9 12/23/2019 0859   CREATININE 1.23 12/23/2019 0859    Wt Readings from Last 3 Encounters:  12/26/20 170 lb (77.1 kg)  04/26/20 176 lb (79.8 kg)  12/23/19 186 lb (84.4 kg)    Patient had AWV with [facility] on 12/19/2020.      Medications: Outpatient Medications Prior to Visit  Medication Sig  . aspirin 81 MG tablet Take 81 mg  by mouth daily.   Marland Kitchen atorvastatin (LIPITOR) 40 MG tablet TAKE 1 TABLET BY MOUTH EVERY DAY  . Cholecalciferol (VITAMIN D) 2000 UNITS tablet Take 2,000 Units by mouth daily.   Marland Kitchen CINNAMON PO Take 1,000 mg by mouth daily.   . Coenzyme Q10 (CO Q-10) 100 MG CAPS Take 200 mg by mouth daily.   . fluticasone (FLONASE) 50 MCG/ACT nasal spray instill 2 sprays into each nostril once daily  . lisinopril (ZESTRIL) 40 MG tablet TAKE 1 TABLET BY MOUTH EVERY DAY  . loratadine (CLARITIN) 10 MG tablet Take 10 mg by mouth daily.   . vitamin C (ASCORBIC ACID) 500 MG tablet Take 500 mg by mouth daily.  Marland Kitchen FLUAD 0.5 ML SUSY inject 0.5 milliliter intramuscularly (Patient not taking: No sig reported)   No facility-administered medications prior to visit.    Review of Systems  All other  systems reviewed and are negative.       Objective    BP 116/64   Pulse (!) 51   Temp 98.1 F (36.7 C)   Resp 16   Ht 5\' 8"  (1.727 m)   Wt 170 lb (77.1 kg)   BMI 25.85 kg/m  BP Readings from Last 3 Encounters:  12/26/20 116/64  04/26/20 129/72  12/23/19 121/76   Wt Readings from Last 3 Encounters:  12/26/20 170 lb (77.1 kg)  04/26/20 176 lb (79.8 kg)  12/23/19 186 lb (84.4 kg)       Physical Exam Vitals reviewed.  Constitutional:      Appearance: He is well-developed.  HENT:     Head: Normocephalic and atraumatic.     Right Ear: External ear normal.     Left Ear: External ear normal.     Nose: Nose normal.  Eyes:     General: No scleral icterus.    Conjunctiva/sclera: Conjunctivae normal.     Pupils: Pupils are equal, round, and reactive to light.  Cardiovascular:     Rate and Rhythm: Normal rate and regular rhythm.     Heart sounds: Normal heart sounds.  Pulmonary:     Effort: Pulmonary effort is normal.     Breath sounds: Normal breath sounds.  Abdominal:     General: Bowel sounds are normal.     Palpations: Abdomen is soft.  Genitourinary:    Penis: Normal.      Testes: Normal.  Musculoskeletal:     Cervical back: Normal range of motion and neck supple.     Right lower leg: No edema.     Left lower leg: No edema.  Skin:    General: Skin is warm and dry.  Neurological:     General: No focal deficit present.     Mental Status: He is alert and oriented to person, place, and time.  Psychiatric:        Mood and Affect: Mood normal.        Behavior: Behavior normal.        Thought Content: Thought content normal.        Judgment: Judgment normal.       No results found for any visits on 12/26/20.  Assessment & Plan     1. Essential (primary) hypertension Explained control as patient has lost 17 pounds since last year.  Intentional weight loss. - CBC w/Diff/Platelet - Comprehensive Metabolic Panel (CMET)  2. Borderline diabetes A1c should be  improved - Hemoglobin A1c  3. Hypercholesterolemia On Lipitor 40 - Lipid panel - TSH  4. Screening  for blood or protein in urine Colonoscopy again in 2028.  Follow-up in 1 year - POCT urinalysis dipstick   No follow-ups on file.      I, Wilhemena Durie, MD, have reviewed all documentation for this visit. The documentation on 12/26/20 for the exam, diagnosis, procedures, and orders are all accurate and complete.    Lanson Randle Cranford Mon, MD  [facility] [phone] (phone) [phone] (fax)  [place]

## 2020-12-27 LAB — CBC WITH DIFFERENTIAL/PLATELET
Basophils Absolute: 0 10*3/uL (ref 0.0–0.2)
Basos: 1 %
EOS (ABSOLUTE): 0.1 10*3/uL (ref 0.0–0.4)
Eos: 2 %
Hematocrit: 45.4 % (ref 37.5–51.0)
Hemoglobin: 15.4 g/dL (ref 13.0–17.7)
Immature Grans (Abs): 0 10*3/uL (ref 0.0–0.1)
Immature Granulocytes: 0 %
Lymphocytes Absolute: 1.4 10*3/uL (ref 0.7–3.1)
Lymphs: 24 %
MCH: 31 pg (ref 26.6–33.0)
MCHC: 33.9 g/dL (ref 31.5–35.7)
MCV: 92 fL (ref 79–97)
Monocytes Absolute: 0.6 10*3/uL (ref 0.1–0.9)
Monocytes: 10 %
Neutrophils Absolute: 3.7 10*3/uL (ref 1.4–7.0)
Neutrophils: 63 %
Platelets: 210 10*3/uL (ref 150–450)
RBC: 4.96 x10E6/uL (ref 4.14–5.80)
RDW: 12.2 % (ref 11.6–15.4)
WBC: 5.9 10*3/uL (ref 3.4–10.8)

## 2020-12-27 LAB — LIPID PANEL
Chol/HDL Ratio: 2.3 ratio (ref 0.0–5.0)
Cholesterol, Total: 121 mg/dL (ref 100–199)
HDL: 53 mg/dL (ref >39)
LDL Chol Calc (NIH): 58 mg/dL (ref 0–99)
Triglycerides: 41 mg/dL (ref 0–149)
VLDL Cholesterol Cal: 10 mg/dL (ref 5–40)

## 2020-12-27 LAB — HEMOGLOBIN A1C
Est. average glucose Bld gHb Est-mCnc: 120 mg/dL
Hgb A1c MFr Bld: 5.8 % — ABNORMAL HIGH (ref 4.8–5.6)

## 2020-12-27 LAB — COMPREHENSIVE METABOLIC PANEL
ALT: 20 IU/L (ref 0–44)
AST: 21 IU/L (ref 0–40)
Albumin/Globulin Ratio: 2.4 — ABNORMAL HIGH (ref 1.2–2.2)
Albumin: 4.5 g/dL (ref 3.7–4.7)
Alkaline Phosphatase: 80 IU/L (ref 44–121)
BUN/Creatinine Ratio: 17 (ref 10–24)
BUN: 18 mg/dL (ref 8–27)
Bilirubin Total: 0.6 mg/dL (ref 0.0–1.2)
CO2: 22 mmol/L (ref 20–29)
Calcium: 9.5 mg/dL (ref 8.6–10.2)
Chloride: 104 mmol/L (ref 96–106)
Creatinine, Ser: 1.05 mg/dL (ref 0.76–1.27)
Globulin, Total: 1.9 g/dL (ref 1.5–4.5)
Glucose: 94 mg/dL (ref 65–99)
Potassium: 4.6 mmol/L (ref 3.5–5.2)
Sodium: 140 mmol/L (ref 134–144)
Total Protein: 6.4 g/dL (ref 6.0–8.5)
eGFR: 76 mL/min/{1.73_m2} (ref >59)

## 2020-12-27 LAB — TSH: TSH: 2.65 u[IU]/mL (ref 0.450–4.500)

## 2020-12-27 LAB — SPECIMEN STATUS REPORT

## 2020-12-29 ENCOUNTER — Telehealth: Payer: Self-pay

## 2020-12-29 NOTE — Telephone Encounter (Signed)
Copied from [place] [id number]. Topic: Quick Communication - Lab Results (Clinic Use ONLY) >> Dec 29, 2020  8:44 AM Lennox Solders wrote: Pt has viewed his lab results on mychart and would like a nurse to call him to go over the bloodworkresults. Pt has a question concerning the results

## 2020-12-29 NOTE — Telephone Encounter (Signed)
Please advise results? 

## 2021-02-02 DIAGNOSIS — Z23 Encounter for immunization: Secondary | ICD-10-CM | POA: Diagnosis not present

## 2021-03-21 ENCOUNTER — Other Ambulatory Visit: Payer: Self-pay | Admitting: Family Medicine

## 2021-05-17 IMAGING — CR DG HIP (WITH OR WITHOUT PELVIS) 2-3V*R*
1 series · 3 of 3 positions shown · non-contrast
Comparison: None

CLINICAL DATA: RIGHT lateral hip pain, fell in [REDACTED]

EXAM:
DG HIP (WITH OR WITHOUT PELVIS) 2-3V RIGHT

[Series 1: dg hip unilat w or w/o pelvis 2-3 views  · non-contrast · 0.14mm/px · 3 of 3 slices shown]
[im 1/3]
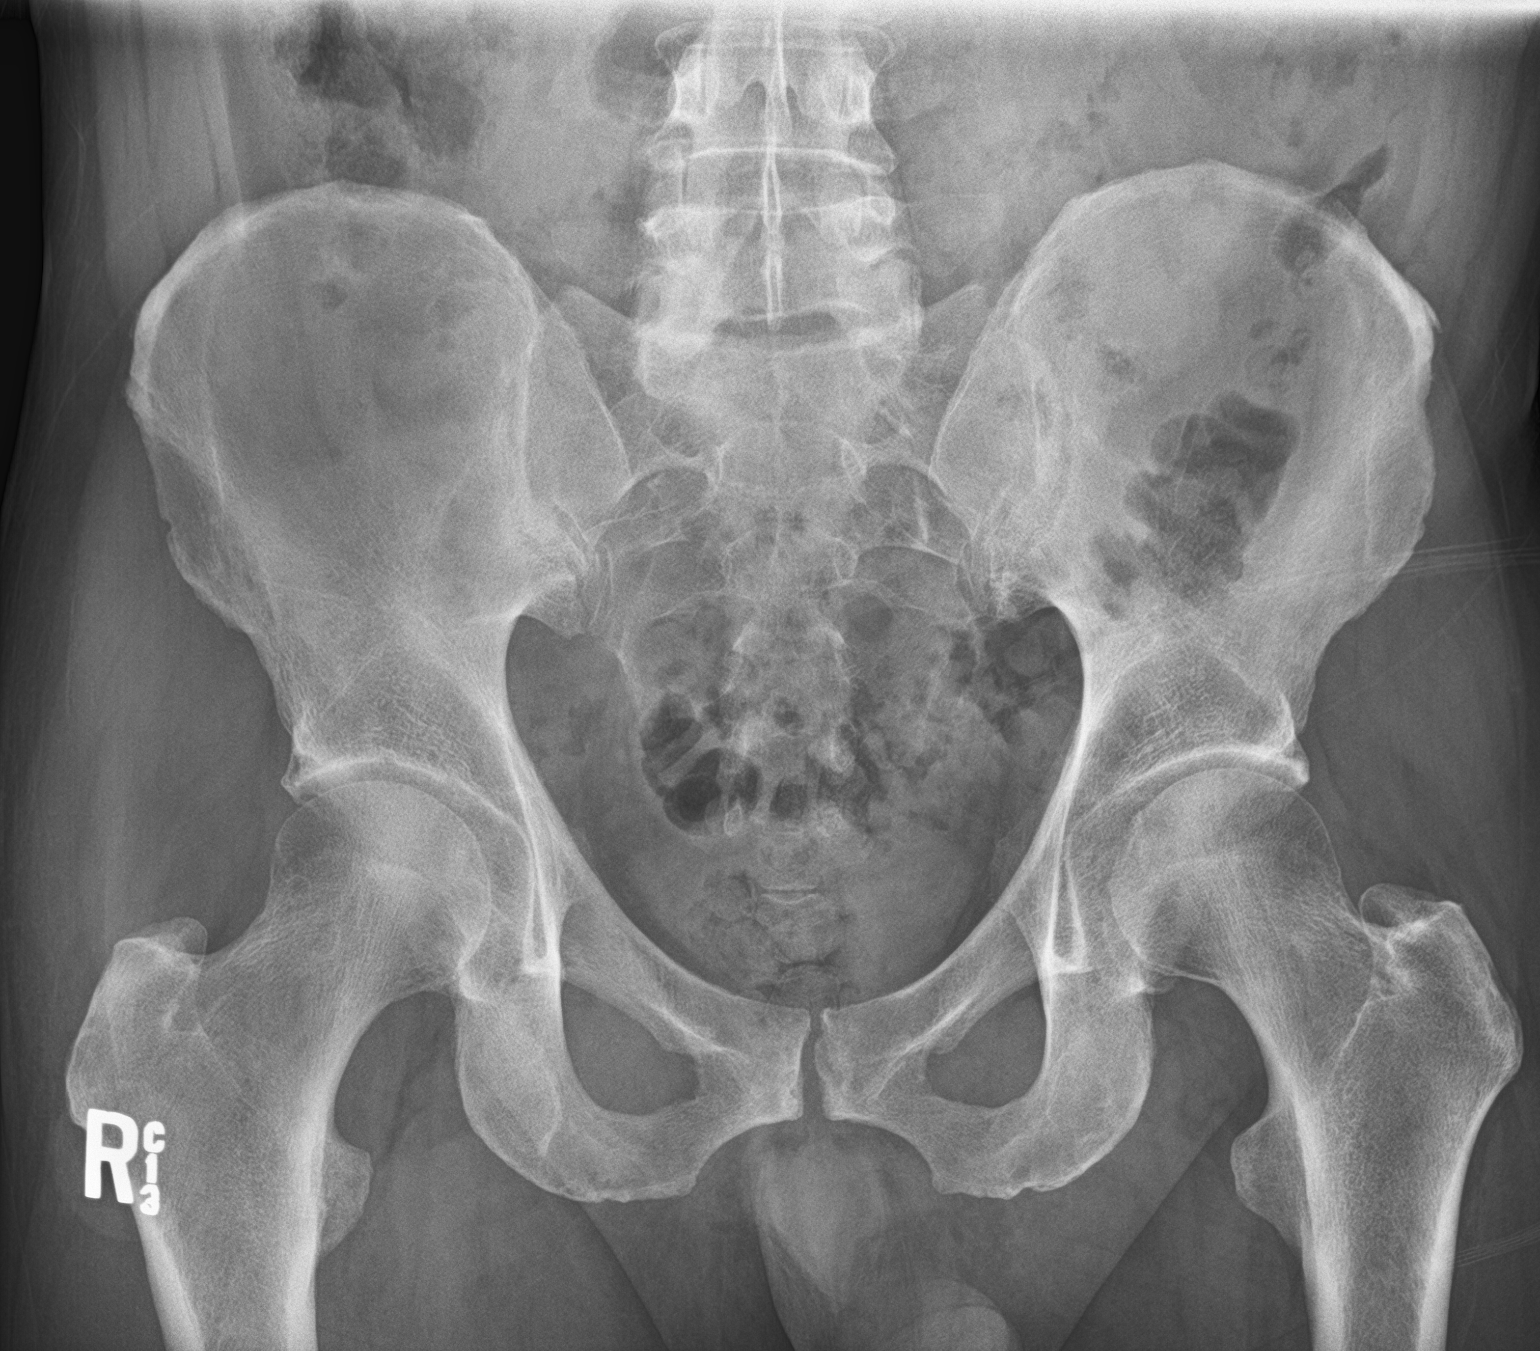
[im 2/3]
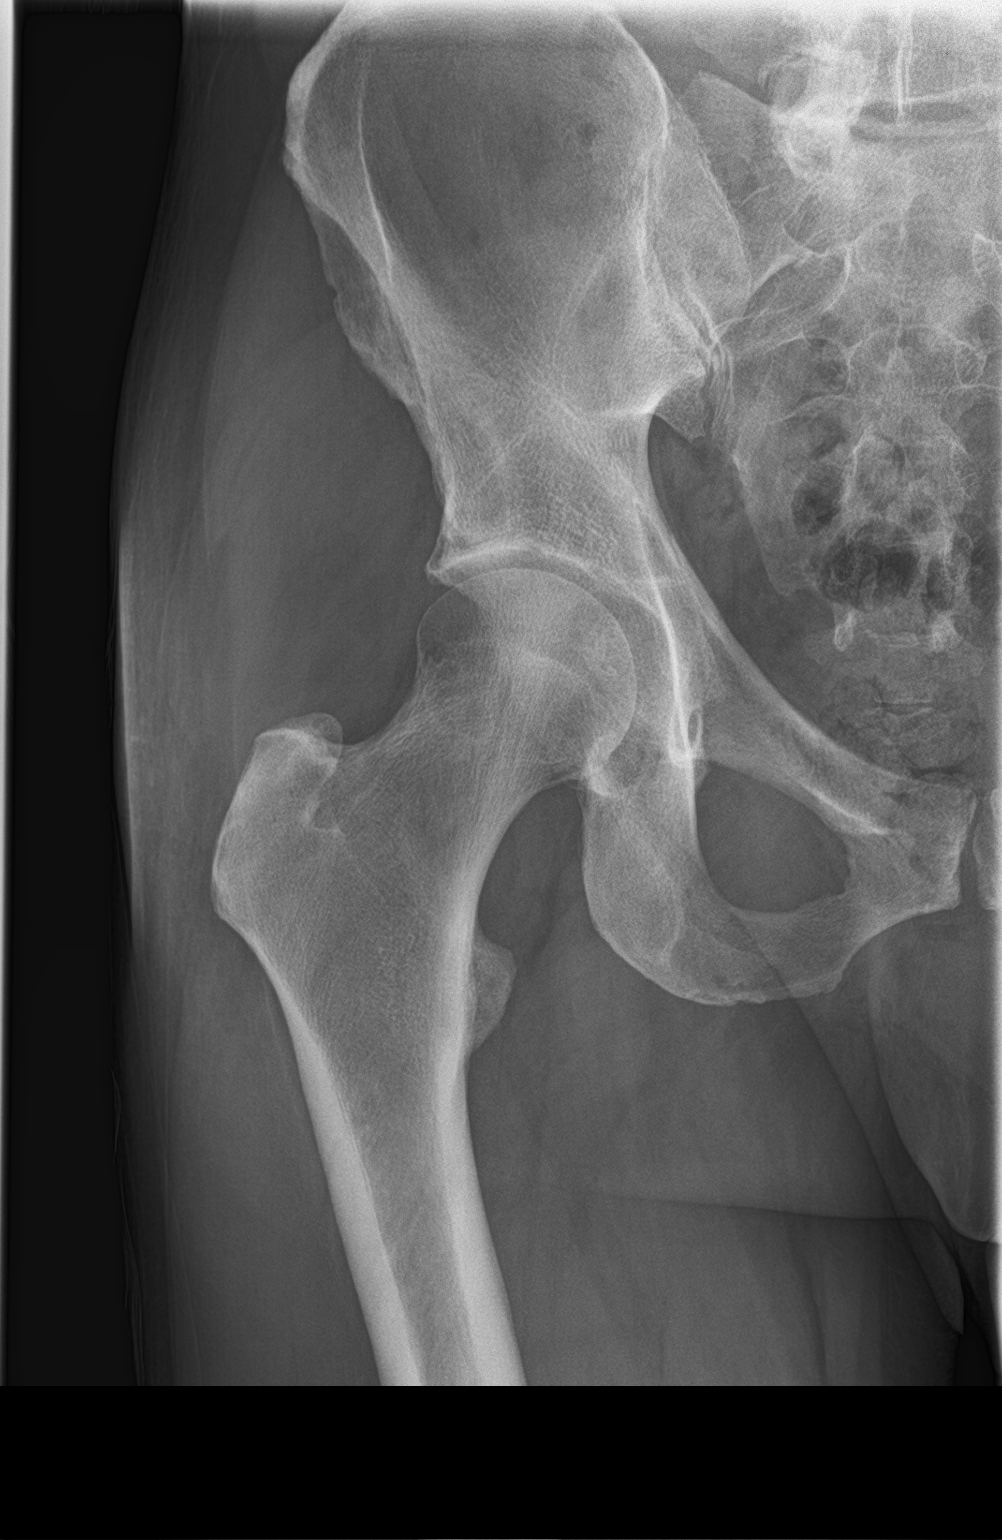
[im 3/3]
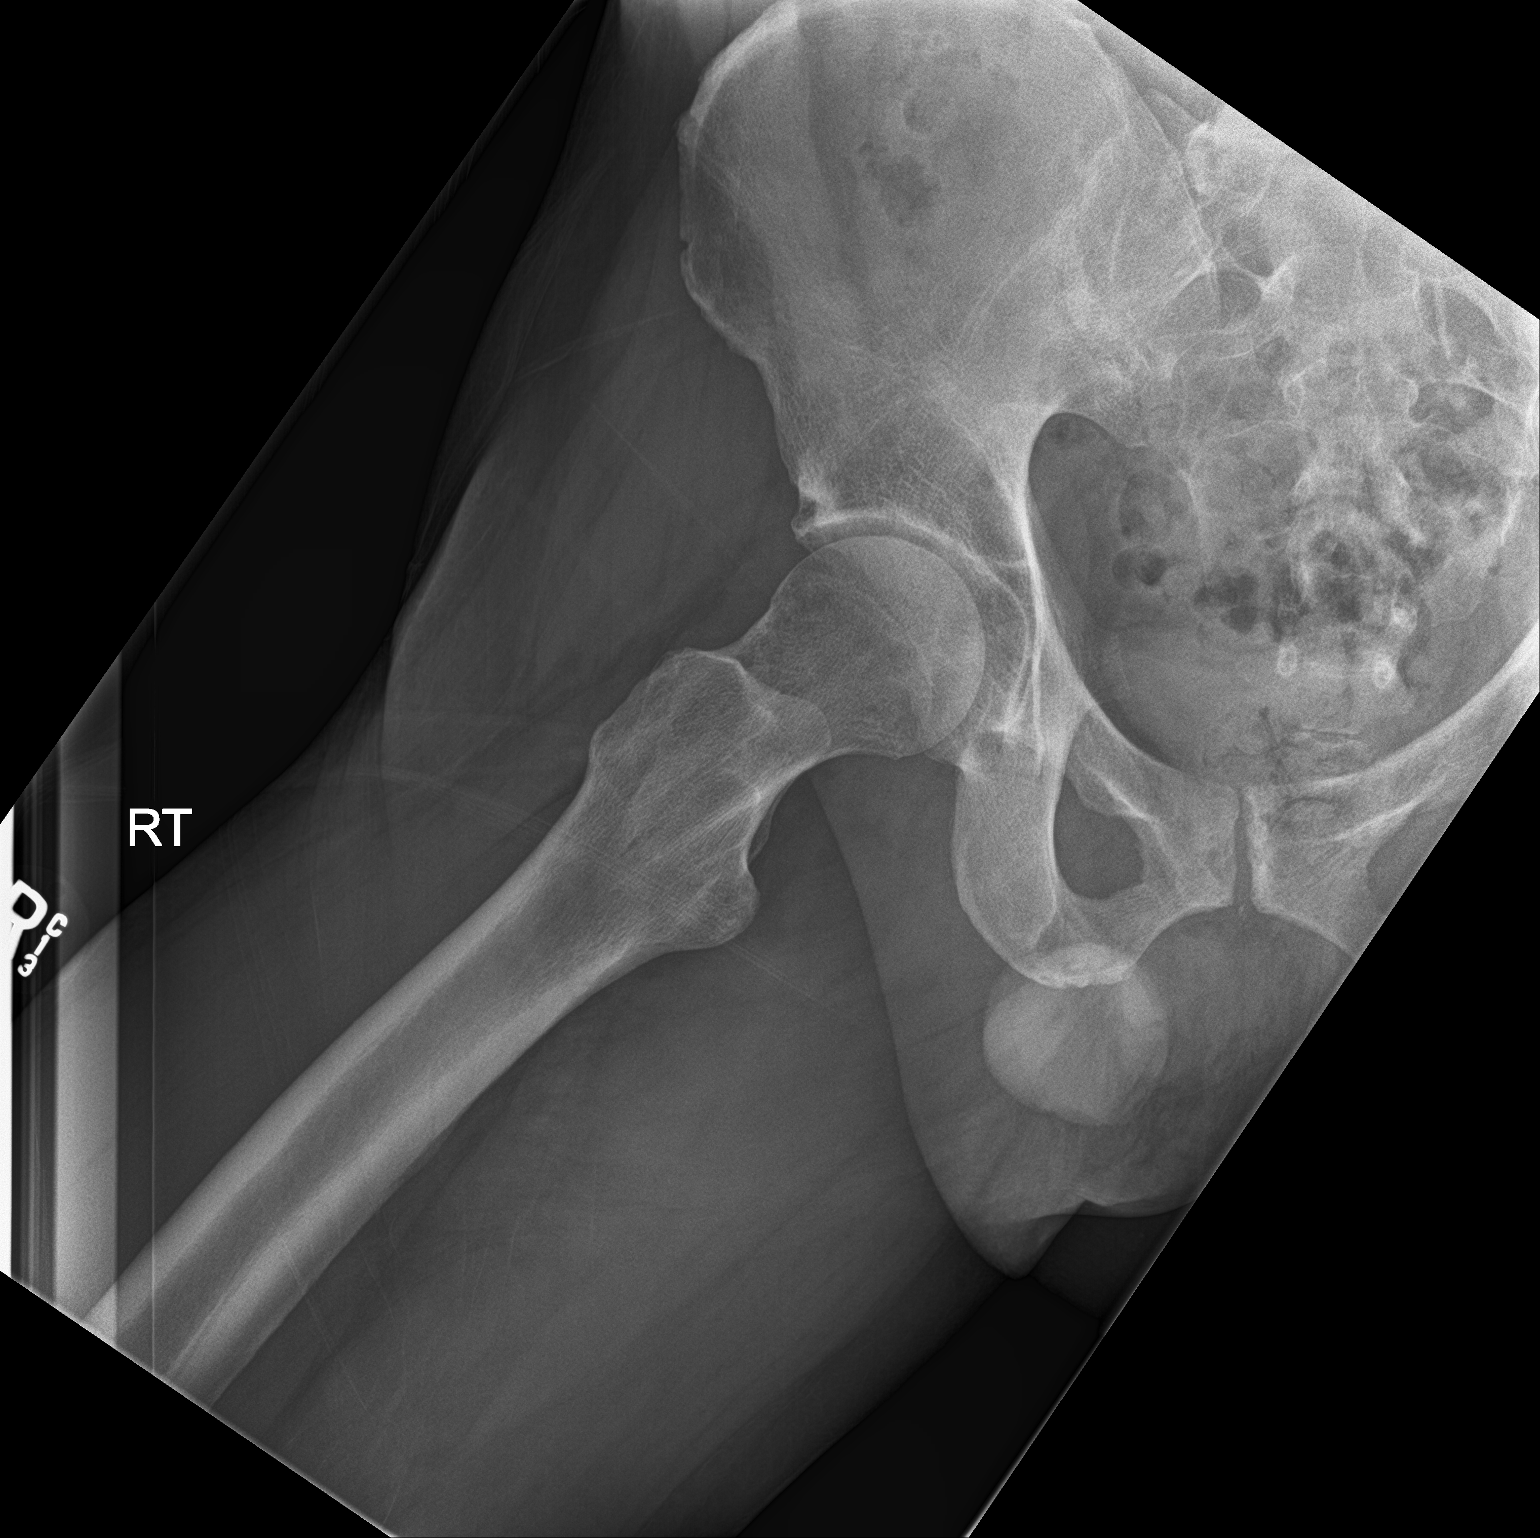

[3 of 3 positions shown; findings below may reference images not displayed]

FINDINGS: Low normal osseous mineralization.

Hip and SI joint spaces preserved.

No acute fracture, dislocation, or bone destruction.
IMPRESSION: No osseous abnormalities.

## 2021-06-26 DIAGNOSIS — Z23 Encounter for immunization: Secondary | ICD-10-CM | POA: Diagnosis not present

## 2021-07-20 DIAGNOSIS — Z23 Encounter for immunization: Secondary | ICD-10-CM | POA: Diagnosis not present

## 2021-07-26 DIAGNOSIS — H2513 Age-related nuclear cataract, bilateral: Secondary | ICD-10-CM | POA: Diagnosis not present

## 2021-07-26 DIAGNOSIS — H524 Presbyopia: Secondary | ICD-10-CM | POA: Diagnosis not present

## 2021-09-22 ENCOUNTER — Other Ambulatory Visit: Payer: Self-pay | Admitting: Family Medicine

## 2021-09-22 DIAGNOSIS — I10 Essential (primary) hypertension: Secondary | ICD-10-CM

## 2021-09-23 NOTE — Telephone Encounter (Signed)
Requested Prescriptions  Pending Prescriptions Disp Refills   lisinopril (ZESTRIL) 40 MG tablet [Pharmacy Med Name: LISINOPRIL 40 MG TABLET] 90 tablet 0    Sig: TAKE 1 TABLET BY MOUTH EVERY DAY     Cardiovascular:  ACE Inhibitors Failed - 09/22/2021  8:38 PM      Failed - Cr in normal range and within 180 days    Creatinine, Ser  Date Value Ref Range Status  12/26/2020 1.05 0.76 - 1.27 mg/dL Final         Failed - K in normal range and within 180 days    Potassium  Date Value Ref Range Status  12/26/2020 4.6 3.5 - 5.2 mmol/L Final         Failed - Valid encounter within last 6 months    Recent Outpatient Visits          9 months ago Essential (primary) hypertension   [facility] Jerrol Banana., MD   1 year ago Essential (primary) hypertension   [facility] Jerrol Banana., MD   1 year ago Borderline diabetes   [facility] Jerrol Banana., MD   3 years ago Essential (primary) hypertension   [facility] Jerrol Banana., MD   4 years ago Essential (primary) hypertension   [facility] Jerrol Banana., MD             Passed - Patient is not pregnant      Passed - Last BP in normal range    BP Readings from Last 1 Encounters:  12/26/20 116/64

## 2021-10-10 DIAGNOSIS — H2511 Age-related nuclear cataract, right eye: Secondary | ICD-10-CM | POA: Diagnosis not present

## 2021-10-10 DIAGNOSIS — H2513 Age-related nuclear cataract, bilateral: Secondary | ICD-10-CM | POA: Diagnosis not present

## 2021-10-10 DIAGNOSIS — H18413 Arcus senilis, bilateral: Secondary | ICD-10-CM | POA: Diagnosis not present

## 2021-10-10 DIAGNOSIS — H40013 Open angle with borderline findings, low risk, bilateral: Secondary | ICD-10-CM | POA: Diagnosis not present

## 2021-10-10 DIAGNOSIS — H25013 Cortical age-related cataract, bilateral: Secondary | ICD-10-CM | POA: Diagnosis not present

## 2021-12-14 ENCOUNTER — Telehealth: Payer: Self-pay | Admitting: *Deleted

## 2021-12-14 NOTE — Chronic Care Management (AMB) (Signed)
?  Chronic Care Management  ? ?Note ? ?12/14/2021 ?Name: Jack Lutz MRN: [id number] DOB: 12/29/1950 ? ?Jack Lutz is a 71 y.o. year old male who is a primary care patient of Jerrol Banana., MD. I reached out to Elwin Mocha by phone today in response to a referral sent by Mr. Amr Sturtevant Leighton's PCP. ? ?Mr. Wiker was given information about Chronic Care Management services today including:  ?CCM service includes personalized support from designated clinical staff supervised by his physician, including individualized plan of care and coordination with other care providers ?24/7 contact phone numbers for assistance for urgent and routine care needs. ?Service will only be billed when office clinical staff spend 20 minutes or more in a month to coordinate care. ?Only one practitioner may furnish and bill the service in a calendar month. ?The patient may stop CCM services at any time (effective at the end of the month) by phone call to the office staff. ?The patient is responsible for co-pay (up to 20% after annual deductible is met) if co-pay is required by the individual health plan.  ? ?Patient agreed to services and verbal consent obtained.  ? ?Follow up plan: ?Telephone appointment with care management team member scheduled for: 12/18/2021 ? ?Karielle Davidow, CCMA ?Care Guide, Embedded Care Coordination ?[place]  Care Management  ?Direct Dial: [phone] ? ? ?

## 2021-12-18 ENCOUNTER — Ambulatory Visit (INDEPENDENT_AMBULATORY_CARE_PROVIDER_SITE_OTHER): Payer: Medicare Other

## 2021-12-18 DIAGNOSIS — E7800 Pure hypercholesterolemia, unspecified: Secondary | ICD-10-CM

## 2021-12-18 DIAGNOSIS — R7303 Prediabetes: Secondary | ICD-10-CM

## 2021-12-18 DIAGNOSIS — I10 Essential (primary) hypertension: Secondary | ICD-10-CM

## 2021-12-18 NOTE — Chronic Care Management (AMB) (Unsigned)
Chronic Care Management   CCM RN Visit Note  12/18/2021 Name: Jack Lutz MRN: [id number] DOB: 1950-10-19  Subjective: Jack Lutz is a 71 y.o. year old male who is a primary care patient of Jerrol Banana., MD. The care management team was consulted for assistance with disease management and care coordination needs.    Engaged with patient by telephone for follow up visit in response to provider referral for case management and/or care coordination services.   Consent to Services:  The patient was given the following information about Chronic Care Management services 1. CCM service includes personalized support from designated clinical staff supervised by the primary care provider, including individualized plan of care and coordination with other care providers 2. 24/7 contact phone numbers for assistance for urgent and routine care needs. 3. Service will only be billed when office clinical staff spend 20 minutes or more in a month to coordinate care. 4. Only one practitioner may furnish and bill the service in a calendar month. 5.The patient may stop CCM services at any time (effective at the end of the month) by phone call to the office staff. 6. The patient will be responsible for cost sharing (co-pay) of up to 20% of the service fee (after annual deductible is met). Patient agreed to services and consent obtained.   Assessment: Review of patient past medical history, allergies, medications, health status, including review of consultants reports, laboratory and other test data, was performed as part of comprehensive evaluation and provision of chronic care management services.   SDOH (Social Determinants of Health) assessments and interventions performed:  SDOH Interventions    Flowsheet Row Most Recent Value  SDOH Interventions   Food Insecurity Interventions Intervention Not Indicated  Transportation Interventions Intervention Not Indicated        CCM Care Plan  No  Known Allergies  Outpatient Encounter Medications as of 12/18/2021  Medication Sig   aspirin 81 MG tablet Take 81 mg by mouth daily.    atorvastatin (LIPITOR) 40 MG tablet TAKE 1 TABLET BY MOUTH EVERY DAY   CINNAMON PO Take 1,000 mg by mouth daily.    Coenzyme Q10 (CO Q-10) 100 MG CAPS Take 200 mg by mouth daily.    fluticasone (FLONASE) 50 MCG/ACT nasal spray instill 2 sprays into each nostril once daily   lisinopril (ZESTRIL) 40 MG tablet TAKE 1 TABLET BY MOUTH EVERY DAY   vitamin C (ASCORBIC ACID) 500 MG tablet Take 500 mg by mouth daily.   Cholecalciferol (VITAMIN D) 2000 UNITS tablet Take 2,000 Units by mouth daily.    FLUAD 0.5 ML SUSY inject 0.5 milliliter intramuscularly (Patient not taking: No sig reported)   loratadine (CLARITIN) 10 MG tablet Take 10 mg by mouth daily.  (Patient not taking: Reported on 12/18/2021)   No facility-administered encounter medications on file as of 12/18/2021.    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   [facility] Care Management [facility] [phone]

## 2021-12-18 NOTE — Patient Instructions (Signed)
Thank you for allowing the Chronic Care Management team to participate in your care. It was great speaking with you today! Please do not hesitate to contact me if you require assistance before our telephone outreach in 6 months.   Following is a copy of your full care plan:    Consent to CCM Services: Mr. Hereford was given information about Chronic Care Management services including:  CCM service includes personalized support from designated clinical staff supervised by his physician, including individualized plan of care and coordination with other care providers 24/7 contact phone numbers for assistance for urgent and routine care needs. Service will only be billed when office clinical staff spend 20 minutes or more in a month to coordinate care. Only one practitioner may furnish and bill the service in a calendar month. The patient may stop CCM services at any time (effective at the end of the month) by phone call to the office staff. The patient will be responsible for cost sharing (co-pay) of up to 20% of the service fee (after annual deductible is met).  Patient agreed to services and verbal consent obtained.   The patient verbalized understanding of instructions, educational materials, and care plan provided today and declined offer to receive copy of patient instructions, educational materials, and care plan.   A member of the care management team will follow up in 6 months.   [facility] Care Management [facility] [phone]

## 2021-12-22 ENCOUNTER — Other Ambulatory Visit: Payer: Self-pay | Admitting: Family Medicine

## 2021-12-22 NOTE — Telephone Encounter (Signed)
Requested Prescriptions  ?Pending Prescriptions Disp Refills  ?? atorvastatin (LIPITOR) 40 MG tablet [Pharmacy Med Name: ATORVASTATIN 40 MG TABLET] 90 tablet 0  ?  Sig: TAKE 1 TABLET BY MOUTH EVERY DAY  ?  ? Cardiovascular:  Antilipid - Statins Failed - 12/22/2021  2:07 AM  ?  ?  Failed - Valid encounter within last 12 months  ?  Recent Outpatient Visits   ?      ? 12 months ago Essential (primary) hypertension  ? [facility] Jerrol Banana., MD  ? 1 year ago Essential (primary) hypertension  ? [facility] Jerrol Banana., MD  ? 2 years ago Borderline diabetes  ? [facility] Jerrol Banana., MD  ? 3 years ago Essential (primary) hypertension  ? [facility] Jerrol Banana., MD  ? 4 years ago Essential (primary) hypertension  ? [facility] Jerrol Banana., MD  ?  ?  ? ?  ?  ?  Failed - Lipid Panel in normal range within the last 12 months  ?  Cholesterol, Total  ?Date Value Ref Range Status  ?12/26/2020 121 100 - 199 mg/dL Final  ? ?LDL Chol Calc (NIH)  ?Date Value Ref Range Status  ?12/26/2020 58 0 - 99 mg/dL Final  ? ?HDL  ?Date Value Ref Range Status  ?12/26/2020 53 >39 mg/dL Final  ? ?Triglycerides  ?Date Value Ref Range Status  ?12/26/2020 41 0 - 149 mg/dL Final  ? ?  ?  ?  Passed - Patient is not pregnant  ?  ?  ? ?

## 2021-12-22 NOTE — Progress Notes (Signed)
?  ? ? ?Established patient visit ? ?I,[name],acting as a scribe for Wilhemena Durie, MD.,have documented all relevant documentation on the behalf of Wilhemena Durie, MD,as directed by  Wilhemena Durie, MD while in the presence of Wilhemena Durie, MD. ? ? ?Patient: Jack Lutz   DOB: Feb 15, 1951   71 y.o. Male  MRN: [id number] ?Visit Date: 12/26/2021 ? ?Today's healthcare provider: Wilhemena Durie, MD  ? ?Chief Complaint  ?Patient presents with  ? Follow-up  ? Hypertension  ? Hyperlipidemia  ? Prediabetes  ? ?Subjective  ?  ?HPI  ?Patient comes in today for follow-up.  He feels well.  He has cataract surgery bilaterally for removal next month.  His right plantar fasciitis is better. ?He is taking his blood pressures at home and they are okay according to the patient.  He feels well overall ?Patient had AWV with [facility] on 12/25/2020. ? ?Hypertension, follow-up ? ?BP Readings from Last 3 Encounters:  ?12/26/21 (!) 142/82  ?12/26/20 116/64  ?04/26/20 129/72  ? Wt Readings from Last 3 Encounters:  ?12/26/21 173 lb (78.5 kg)  ?12/25/21 170 lb (77.1 kg)  ?12/26/20 170 lb (77.1 kg)  ?  ? ?He was last seen for hypertension 1 years ago.  ?BP at that visit was 116/64. ?Management since that visit includes; Explained control as patient has lost 17 pounds since last year.  Intentional weight loss. ?He reports good compliance with treatment. ?He is not having side effects. none ?He is exercising. ?He is adherent to low salt diet.   ?Outside blood pressures are normal. ? ?He does not smoke. ? ?Use of agents associated with hypertension: none.  ? ?--------------------------------------------------------------------------------------------------- ?Lipid/Cholesterol, follow-up ? ?Last Lipid Panel: ?Lab Results  ?Component Value Date  ? CHOL 121 12/26/2020  ? [place] 58 12/26/2020  ? HDL 53 12/26/2020  ? TRIG 41 12/26/2020  ? ? ?He was last seen for this 1 years ago.  ?Management since that visit includes; On  Lipitor 40. ? ?He reports good compliance with treatment. ?He is not having side effects. none ? ?He is following a Regular, Low Sodium diet. ?Current exercise: walking ? ?Last metabolic panel ?Lab Results  ?Component Value Date  ? GLUCOSE 94 12/26/2020  ? NA 140 12/26/2020  ? K 4.6 12/26/2020  ? BUN 18 12/26/2020  ? CREATININE 1.05 12/26/2020  ? EGFR 76 12/26/2020  ? GFRNONAA 59 (L) 12/23/2019  ? CALCIUM 9.5 12/26/2020  ? AST 21 12/26/2020  ? ALT 20 12/26/2020  ? ?The ASCVD Risk score (Arnett DK, et al., 2019) failed to calculate for the following reasons: ?  The valid total cholesterol range is 130 to 320 mg/dL ? ?--------------------------------------------------------------------------------------------------- ?Prediabetes, Follow-up ? ?Lab Results  ?Component Value Date  ? HGBA1C 5.8 (H) 12/26/2020  ? HGBA1C 5.6 04/26/2020  ? HGBA1C 6.2 (H) 12/23/2019  ? GLUCOSE 94 12/26/2020  ? GLUCOSE 97 12/23/2019  ? GLUCOSE 99 03/26/2018  ? ? ?Last seen for for this1 years ago.  ?Management since that visit includes; no changes.Marland Kitchen ?Current symptoms include none and have been unchanged. ? ?Prior visit with dietician: no ?Current diet: well balanced ?Current exercise: walking ? ?Pertinent Labs: ?   ?Component Value Date/Time  ? CHOL 121 12/26/2020 0000  ? TRIG 41 12/26/2020 0000  ? CHOLHDL 2.3 12/26/2020 0000  ? CREATININE 1.05 12/26/2020 0000  ? ? ?Wt Readings from Last 3 Encounters:  ?12/26/21 173 lb (78.5 kg)  ?12/25/21 170 lb (77.1 kg)  ?12/26/20 170  lb (77.1 kg)  ? ? ?----------------------------------------------------------------------------------------- ? ? ?Medications: ?Outpatient Medications Prior to Visit  ?Medication Sig  ? aspirin 81 MG tablet Take 81 mg by mouth daily.   ? atorvastatin (LIPITOR) 40 MG tablet TAKE 1 TABLET BY MOUTH EVERY DAY  ? cetirizine (ZYRTEC) 10 MG tablet Take 10 mg by mouth daily.  ? Cholecalciferol (VITAMIN D) 2000 UNITS tablet Take 2,000 Units by mouth daily.   ? CINNAMON PO Take 1,000 mg  by mouth daily.   ? Coenzyme Q10 (CO Q-10) 100 MG CAPS Take 200 mg by mouth daily.   ? FLUAD 0.5 ML SUSY inject 0.5 milliliter intramuscularly  ? fluticasone (FLONASE) 50 MCG/ACT nasal spray instill 2 sprays into each nostril once daily  ? lisinopril (ZESTRIL) 40 MG tablet TAKE 1 TABLET BY MOUTH EVERY DAY  ? vitamin C (ASCORBIC ACID) 500 MG tablet Take 500 mg by mouth daily.  ? ketorolac (ACULAR) 0.5 % ophthalmic solution Place 1 drop into the right eye 4 (four) times daily. (Patient not taking: Reported on 12/25/2021)  ? loratadine (CLARITIN) 10 MG tablet Take 10 mg by mouth daily.  (Patient not taking: Reported on 12/18/2021)  ? moxifloxacin (VIGAMOX) 0.5 % ophthalmic solution Place 1 drop into the right eye 4 (four) times daily. (Patient not taking: Reported on 12/25/2021)  ? prednisoLONE acetate (PRED FORTE) 1 % ophthalmic suspension Place 1 drop into the left eye 4 (four) times daily. (Patient not taking: Reported on 12/25/2021)  ? ?No facility-administered medications prior to visit.  ? ? ?Review of Systems  ?All other systems reviewed and are negative. ? ? ?  Objective  ?  ?BP (!) 142/82 (BP Location: Left Arm, Patient Position: Sitting, Cuff Size: Large)   Pulse 66   Temp 98.5 ?F (36.9 ?C) (Temporal)   Resp 16   Ht _0  (1.727 m)   Wt 173 lb (78.5 kg)   SpO2 99%   BMI 26.30 kg/m?  ? ? ?Physical Exam ?Vitals reviewed.  ?Constitutional:   ?   Appearance: He is well-developed.  ?HENT:  ?   Head: Normocephalic and atraumatic.  ?   Right Ear: External ear normal.  ?   Left Ear: External ear normal.  ?   Nose: Nose normal.  ?Eyes:  ?   Conjunctiva/sclera: Conjunctivae normal.  ?   Pupils: Pupils are equal, round, and reactive to light.  ?Cardiovascular:  ?   Rate and Rhythm: Normal rate and regular rhythm.  ?   Heart sounds: Normal heart sounds.  ?Pulmonary:  ?   Effort: Pulmonary effort is normal.  ?   Breath sounds: Normal breath sounds.  ?Abdominal:  ?   General: Bowel sounds are normal.  ?   Palpations:  Abdomen is soft.  ?Musculoskeletal:  ?   Cervical back: Normal range of motion and neck supple.  ?   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   General: Skin is warm and dry.  ?   Comments: Right forehead with AK versus early basal cell versus squamous cell, about the size of a pencil eraser or smaller  ?Neurological:  ?   General: No focal deficit present.  ?   Mental Status: He is alert and oriented to person, place, and time.  ?Psychiatric:     ?   Mood and Affect: Mood normal.     ?   Behavior: Behavior normal.     ?   Thought Content: Thought content normal.     ?  Judgment: Judgment normal.  ?  ? ? ?No results found for any visits on 12/26/21. ? Assessment & Plan  ?  ? ?1. Essential (primary) hypertension ?Check home blood pressure readings.  130/80 this morning ?- Lipid panel ?- TSH ?- CBC w/Diff/Platelet ?- Comprehensive Metabolic Panel (CMET) ?- Hemoglobin A1c ? ?2. Hypercholesterolemia ?On atorvastatin ?- Lipid panel ?- TSH ?- CBC w/Diff/Platelet ?- Comprehensive Metabolic Panel (CMET) ?- Hemoglobin A1c ? ?3. Borderline diabetes ?Follow A1c and treat accordingly.  Weight is at good level. ?- Lipid panel ?- TSH ?- CBC w/Diff/Platelet ?- Comprehensive Metabolic Panel (CMET) ?- Hemoglobin A1c ? ?4. AK (actinic keratosis) ?Versus basal cell versus squamous cell.  Lesion today in looking at his on the forehead on the right ?- Ambulatory referral to Dermatology ? ?5. Screening for skin cancer ? ?- Ambulatory referral to Dermatology ? ? ?Return in about 1 year (around 12/27/2022).  ?   ? ?I, Wilhemena Durie, MD, have reviewed all documentation for this visit. The documentation on 12/27/21 for the exam, diagnosis, procedures, and orders are all accurate and complete. ? ? ? ?Laban Orourke Cranford Mon, MD  ?[facility] ?[phone] (phone) ?[phone] (fax) ? ?[facility] ?

## 2021-12-23 ENCOUNTER — Other Ambulatory Visit: Payer: Self-pay | Admitting: Family Medicine

## 2021-12-23 DIAGNOSIS — I10 Essential (primary) hypertension: Secondary | ICD-10-CM

## 2021-12-25 ENCOUNTER — Ambulatory Visit (INDEPENDENT_AMBULATORY_CARE_PROVIDER_SITE_OTHER): Payer: Medicare Other

## 2021-12-25 VITALS — Wt 170.0 lb

## 2021-12-25 DIAGNOSIS — Z0000 Encounter for general adult medical examination without abnormal findings: Secondary | ICD-10-CM

## 2021-12-25 NOTE — Progress Notes (Signed)
Virtual Visit via Telephone Note  I connected with  Elwin Mocha on 12/25/21 at  9:00 AM EDT by telephone and verified that I am speaking with the correct person using two identifiers.  Location: Patient: home Provider: [facility] Persons participating in the virtual visit: [place]   I discussed the limitations, risks, security and privacy concerns of performing an evaluation and management service by telephone and the availability of in person appointments. The patient expressed understanding and agreed to proceed.  Interactive audio and video telecommunications were attempted between this nurse and patient, however failed, due to patient having technical difficulties OR patient did not have access to video capability.  We continued and completed visit with audio only.  Some vital signs may be absent or patient reported.   Dionisio David, LPN  Subjective:   AAREN ATALLAH is a 71 y.o. male who presents for Medicare Annual/Subsequent preventive examination.  Review of Systems           Objective:    There were no vitals filed for this visit. There is no height or weight on file to calculate BMI.  Advanced Directives 12/19/2020 03/25/2018 05/15/2017 03/13/2017 05/03/2016 08/23/2015  Does Patient Have a Medical Advance Directive? Yes Yes Yes Yes Yes Yes  Type of Paramedic of [place];Living will Living will Living will [place];Living will Living will;Healthcare Power of Attorney -  Copy of [place] in Chart? Yes - validated most recent copy scanned in chart (See row information) - - No - copy requested No - copy requested -    Current Medications (verified) Outpatient Encounter Medications as of 12/25/2021  Medication Sig   aspirin 81 MG tablet Take 81 mg by mouth daily.    atorvastatin (LIPITOR) 40 MG tablet TAKE 1 TABLET BY MOUTH EVERY DAY   cetirizine (ZYRTEC) 10 MG tablet Take 10 mg by mouth daily.    Cholecalciferol (VITAMIN D) 2000 UNITS tablet Take 2,000 Units by mouth daily.    CINNAMON PO Take 1,000 mg by mouth daily.    Coenzyme Q10 (CO Q-10) 100 MG CAPS Take 200 mg by mouth daily.    FLUAD 0.5 ML SUSY inject 0.5 milliliter intramuscularly (Patient not taking: No sig reported)   fluticasone (FLONASE) 50 MCG/ACT nasal spray instill 2 sprays into each nostril once daily   ketorolac (ACULAR) 0.5 % ophthalmic solution Place 1 drop into the right eye 4 (four) times daily.   loratadine (CLARITIN) 10 MG tablet Take 10 mg by mouth daily.  (Patient not taking: Reported on 12/18/2021)   moxifloxacin (VIGAMOX) 0.5 % ophthalmic solution Place 1 drop into the right eye 4 (four) times daily.   prednisoLONE acetate (PRED FORTE) 1 % ophthalmic suspension Place 1 drop into the left eye 4 (four) times daily.   vitamin C (ASCORBIC ACID) 500 MG tablet Take 500 mg by mouth daily.   [DISCONTINUED] lisinopril (ZESTRIL) 40 MG tablet TAKE 1 TABLET BY MOUTH EVERY DAY   No facility-administered encounter medications on file as of 12/25/2021.    Allergies (verified) Patient has no known allergies.   History: Past Medical History:  Diagnosis Date   Allergy    Hyperlipidemia    Hypertension    Past Surgical History:  Procedure Laterality Date   CHOLECYSTECTOMY  07/05/1996   COLONOSCOPY WITH PROPOFOL N/A 05/15/2017   Procedure: COLONOSCOPY WITH PROPOFOL;  Surgeon: Robert Bellow, MD;  Location: [facility] ENDOSCOPY;  Service: Endoscopy;  Laterality: N/A;  Family History  Problem Relation Age of Onset   Heart attack Father    Heart attack Brother    Other Brother        brain tumor   Heart attack Brother        heart attack x 2   Heart disease Mother    Emphysema Mother    Heart attack Sister    Heart disease Sister    Social History   Socioeconomic History   Marital status: Married    Spouse name: Not on file   Number of children: 1   Years of education: Not on file   Highest education  level: Bachelor's degree (e.g., BA, AB, BS)  Occupational History   Occupation: retired  Tobacco Use   Smoking status: Never   Smokeless tobacco: Never  Vaping Use   Vaping Use: Never used  Substance and Sexual Activity   Alcohol use: Yes    Alcohol/week: 0.0 - 5.0 standard drinks   Drug use: No   Sexual activity: Not on file  Other Topics Concern   Not on file  Social History Narrative   Not on file   Social Determinants of Health   Financial Resource Strain: Not on file  Food Insecurity: No Food Insecurity   Worried About Running Out of Food in the Last Year: Never true   Ran Out of Food in the Last Year: Never true  Transportation Needs: No Transportation Needs   Lack of Transportation (Medical): No   Lack of Transportation (Non-Medical): No  Physical Activity: Not on file  Stress: Not on file  Social Connections: Not on file    Tobacco Counseling Counseling given: Not Answered   Clinical Intake:  Pre-visit preparation completed: Yes        Nutritional Risks: None Diabetes: No CBG done?: No Did pt. bring in CBG monitor from home?: No  How often do you need to have someone help you when you read instructions, pamphlets, or other written materials from your doctor or pharmacy?: 1 - Never  Diabetic?no  Interpreter Needed?: No  Information entered by :: Kirke Shaggy, LPN   Activities of Daily Living No flowsheet data found.  Patient Care Team: Jerrol Banana., MD as PCP - General (Family Medicine) Thelma Comp, [place] as Consulting Physician (Optometry) Bary Castilla, Forest Gleason, MD as Consulting Physician (General Surgery) Ralene Bathe, MD (Dermatology) Neldon Labella, RN as Case Manager  Indicate any recent Medical Services you may have received from other than [facility] providers in the past year (date may be approximate).     Assessment:   This is a routine wellness examination for [place].  Hearing/Vision screen No results  found.  Dietary issues and exercise activities discussed:     Goals Addressed   None    Depression Screen PHQ 2/9 Scores 12/18/2021 12/19/2020 09/29/2019 09/29/2019 03/25/2018 03/13/2017 03/13/2017  PHQ - 2 Score 0 0 0 0 0 0 0  PHQ- 9 Score - - - - - 0 -    Fall Risk Fall Risk  12/18/2021 12/19/2020 09/29/2019 05/07/2019 03/25/2018  Falls in the past year? 0 0 1 0 No  Comment - - - Emmi Telephone Survey: data to providers prior to load -  Number falls in past yr: 0 0 0 - -  Injury with Fall? - 0 0 - -  Follow up Falls prevention discussed - Falls prevention discussed - -    FALL RISK PREVENTION PERTAINING TO THE HOME:  Any stairs in  or around the home? No  If so, are there any without handrails? No  Home free of loose throw rugs in walkways, pet beds, electrical cords, etc? Yes  Adequate lighting in your home to reduce risk of falls? Yes   ASSISTIVE DEVICES UTILIZED TO PREVENT FALLS:  Life alert? No  Use of a cane, walker or w/c? No  Grab bars in the bathroom? No  Shower chair or bench in shower? No  Elevated toilet seat or a handicapped toilet? No    Cognitive Function:    6CIT Screen 09/29/2019 03/13/2017  What Year? 0 points 0 points  What month? 0 points 0 points  What time? 0 points 0 points  Count back from 20 0 points 0 points  Months in reverse 0 points 0 points  Repeat phrase 0 points 0 points  Total Score 0 0    Immunizations Immunization History  Administered Date(s) Administered   Influenza Whole 07/11/2017   Influenza, High Dose Seasonal PF 08/19/2018, 07/01/2019   Moderna Sars-Covid-2 Vaccination 11/06/2019, 12/04/2019, 07/29/2020   Pneumococcal Conjugate-13 01/18/2015   Pneumococcal Polysaccharide-23 02/21/2016   Tdap 10/30/2010   Zoster Recombinat (Shingrix) 07/18/2017, 09/25/2017   Zoster, Live 10/30/2010    TDAP status: Due, Education has been provided regarding the importance of this vaccine. Advised may receive this vaccine at local pharmacy or  Health Dept. Aware to provide a copy of the vaccination record if obtained from local pharmacy or Health Dept. Verbalized acceptance and understanding.  Flu Vaccine status: Up to date  Pneumococcal vaccine status: Up to date  Covid-19 vaccine status: Completed vaccines  Qualifies for Shingles Vaccine? Yes   Zostavax completed Yes   Shingrix Completed?: Yes  Screening Tests Health Maintenance  Topic Date Due   COVID-19 Vaccine (4 - Booster for Moderna series) 09/23/2020   TETANUS/TDAP  10/30/2020   COLONOSCOPY (Pts 45-69yr Insurance coverage will need to be confirmed)  05/16/2027   Pneumonia Vaccine 71 Years old  Completed   INFLUENZA VACCINE  Completed   Hepatitis C Screening  Completed   Zoster Vaccines- Shingrix  Completed   HPV VACCINES  Aged Out    Health Maintenance  Health Maintenance Due  Topic Date Due   COVID-19 Vaccine (4 - Booster for Moderna series) 09/23/2020   TETANUS/TDAP  10/30/2020    Colorectal cancer screening: Type of screening: Colonoscopy. Completed 05/24/17. Repeat every 10 years  Lung Cancer Screening: (Low Dose CT Chest recommended if Age 73-80years, 30 pack-year currently smoking OR have quit w/in 15years.) does not qualify.    Additional Screening:  Hepatitis C Screening: does qualify; Completed 01/18/15  Vision Screening: Recommended annual ophthalmology exams for early detection of glaucoma and other disorders of the eye. Is the patient up to date with their annual eye exam?  Yes  Who is the provider or what is the name of the office in which the patient attends annual eye exams? Dr. BRick DuffIf pt is not established with a provider, would they like to be referred to a provider to establish care? No .   Dental Screening: Recommended annual dental exams for proper oral hygiene  Community Resource Referral / Chronic Care Management: CRR required this visit?  No   CCM required this visit?  No      Plan:     I have personally  reviewed and noted the following in the patient's chart:   Medical and social history Use of alcohol, tobacco or illicit drugs  Current medications and supplements  including opioid prescriptions. Patient is not currently taking opioid prescriptions. Functional ability and status Nutritional status Physical activity Advanced directives List of other physicians Hospitalizations, surgeries, and ER visits in previous 12 months Vitals Screenings to include cognitive, depression, and falls Referrals and appointments  In addition, I have reviewed and discussed with patient certain preventive protocols, quality metrics, and best practice recommendations. A written personalized care plan for preventive services as well as general preventive health recommendations were provided to patient.     Dionisio David, LPN   10/22/5206   Nurse Notes: none

## 2021-12-25 NOTE — Patient Instructions (Addendum)
Jack Lutz , ?Thank you for taking time to come for your Medicare Wellness Visit. I appreciate your ongoing commitment to your health goals. Please review the following plan we discussed and let me know if I can assist you in the future.  ? ?Screening recommendations/referrals: ?Colonoscopy: 05/24/17 ?Recommended yearly ophthalmology/optometry visit for glaucoma screening and checkup ?Recommended yearly dental visit for hygiene and checkup ? ?Vaccinations: ?Influenza vaccine: 07/20/21 ?Pneumococcal vaccine: 02/21/16 ?Tdap vaccine: 10/30/10, due ?Shingles vaccine: Shingrix 07/18/17, 09/25/17  Zostavax 10/30/10   ?Covid-19: 11/06/19, 12/04/19, 07/29/20, 02/02/21, 06/26/21 ? ?Advanced directives: no ? ?Conditions/risks identified: none ? ?Next appointment: Follow up in one year for your annual wellness visit. - 12/27/22 @ 9am by phone ? ?Preventive Care 73 Years and Older, Male ?Preventive care refers to lifestyle choices and visits with your health care provider that can promote health and wellness. ?What does preventive care include? ?A yearly physical exam. This is also called an annual well check. ?Dental exams once or twice a year. ?Routine eye exams. Ask your health care provider how often you should have your eyes checked. ?Personal lifestyle choices, including: ?Daily care of your teeth and gums. ?Regular physical activity. ?Eating a healthy diet. ?Avoiding tobacco and drug use. ?Limiting alcohol use. ?Practicing safe sex. ?Taking low doses of aspirin every day. ?Taking vitamin and mineral supplements as recommended by your health care provider. ?What happens during an annual well check? ?The services and screenings done by your health care provider during your annual well check will depend on your age, overall health, lifestyle risk factors, and family history of disease. ?Counseling  ?Your health care provider may ask you questions about your: ?Alcohol use. ?Tobacco use. ?Drug use. ?Emotional well-being. ?Home and  relationship well-being. ?Sexual activity. ?Eating habits. ?History of falls. ?Memory and ability to understand (cognition). ?Work and work Statistician. ?Screening  ?You may have the following tests or measurements: ?Height, weight, and BMI. ?Blood pressure. ?Lipid and cholesterol levels. These may be checked every 5 years, or more frequently if you are over 1 years old. ?Skin check. ?Lung cancer screening. You may have this screening every year starting at age 53 if you have a 30-pack-year history of smoking and currently smoke or have quit within the past 15 years. ?Fecal occult blood test (FOBT) of the stool. You may have this test every year starting at age 28. ?Flexible sigmoidoscopy or colonoscopy. You may have a sigmoidoscopy every 5 years or a colonoscopy every 10 years starting at age 102. ?Prostate cancer screening. Recommendations will vary depending on your family history and other risks. ?Hepatitis C blood test. ?Hepatitis B blood test. ?Sexually transmitted disease (STD) testing. ?Diabetes screening. This is done by checking your blood sugar (glucose) after you have not eaten for a while (fasting). You may have this done every 1-3 years. ?Abdominal aortic aneurysm (AAA) screening. You may need this if you are a current or former smoker. ?Osteoporosis. You may be screened starting at age 42 if you are at high risk. ?Talk with your health care provider about your test results, treatment options, and if necessary, the need for more tests. ?Vaccines  ?Your health care provider may recommend certain vaccines, such as: ?Influenza vaccine. This is recommended every year. ?Tetanus, diphtheria, and acellular pertussis (Tdap, Td) vaccine. You may need a Td booster every 10 years. ?Zoster vaccine. You may need this after age 79. ?Pneumococcal 13-valent conjugate (PCV13) vaccine. One dose is recommended after age 8. ?Pneumococcal polysaccharide (PPSV23) vaccine. One dose is recommended  after age 22. ?Talk to your  health care provider about which screenings and vaccines you need and how often you need them. ?This information is not intended to replace advice given to you by your health care provider. Make sure you discuss any questions you have with your health care provider. ?Document Released: 10/21/2015 Document Revised: 06/13/2016 Document Reviewed: 07/26/2015 ?Elsevier Interactive Patient Education ? 2017 [place]. ? ?Fall Prevention in the Home ?Falls can cause injuries. They can happen to people of all ages. There are many things you can do to make your home safe and to help prevent falls. ?What can I do on the outside of my home? ?Regularly fix the edges of walkways and driveways and fix any cracks. ?Remove anything that might make you trip as you walk through a door, such as a raised step or threshold. ?Trim any bushes or trees on the path to your home. ?Use bright outdoor lighting. ?Clear any walking paths of anything that might make someone trip, such as rocks or tools. ?Regularly check to see if handrails are loose or broken. Make sure that both sides of any steps have handrails. ?Any raised decks and porches should have guardrails on the edges. ?Have any leaves, snow, or ice cleared regularly. ?Use sand or salt on walking paths during winter. ?Clean up any spills in your garage right away. This includes oil or grease spills. ?What can I do in the bathroom? ?Use night lights. ?Install grab bars by the toilet and in the tub and shower. Do not use towel bars as grab bars. ?Use non-skid mats or decals in the tub or shower. ?If you need to sit down in the shower, use a plastic, non-slip stool. ?Keep the floor dry. Clean up any water that spills on the floor as soon as it happens. ?Remove soap buildup in the tub or shower regularly. ?Attach bath mats securely with double-sided non-slip rug tape. ?Do not have throw rugs and other things on the floor that can make you trip. ?What can I do in the bedroom? ?Use night  lights. ?Make sure that you have a light by your bed that is easy to reach. ?Do not use any sheets or blankets that are too big for your bed. They should not hang down onto the floor. ?Have a firm chair that has side arms. You can use this for support while you get dressed. ?Do not have throw rugs and other things on the floor that can make you trip. ?What can I do in the kitchen? ?Clean up any spills right away. ?Avoid walking on wet floors. ?Keep items that you use a lot in easy-to-reach places. ?If you need to reach something above you, use a strong step stool that has a grab bar. ?Keep electrical cords out of the way. ?Do not use floor polish or wax that makes floors slippery. If you must use wax, use non-skid floor wax. ?Do not have throw rugs and other things on the floor that can make you trip. ?What can I do with my stairs? ?Do not leave any items on the stairs. ?Make sure that there are handrails on both sides of the stairs and use them. Fix handrails that are broken or loose. Make sure that handrails are as long as the stairways. ?Check any carpeting to make sure that it is firmly attached to the stairs. Fix any carpet that is loose or worn. ?Avoid having throw rugs at the top or bottom of the stairs. If you  do have throw rugs, attach them to the floor with carpet tape. ?Make sure that you have a light switch at the top of the stairs and the bottom of the stairs. If you do not have them, ask someone to add them for you. ?What else can I do to help prevent falls? ?Wear shoes that: ?Do not have high heels. ?Have rubber bottoms. ?Are comfortable and fit you well. ?Are closed at the toe. Do not wear sandals. ?If you use a stepladder: ?Make sure that it is fully opened. Do not climb a closed stepladder. ?Make sure that both sides of the stepladder are locked into place. ?Ask someone to hold it for you, if possible. ?Clearly mark and make sure that you can see: ?Any grab bars or handrails. ?First and last  steps. ?Where the edge of each step is. ?Use tools that help you move around (mobility aids) if they are needed. These include: ?Canes. ?Walkers. ?Scooters. ?Crutches. ?Turn on the lights when you go into a da

## 2021-12-26 ENCOUNTER — Other Ambulatory Visit: Payer: Self-pay

## 2021-12-26 ENCOUNTER — Ambulatory Visit (INDEPENDENT_AMBULATORY_CARE_PROVIDER_SITE_OTHER): Payer: Medicare Other | Admitting: Family Medicine

## 2021-12-26 ENCOUNTER — Encounter: Payer: Self-pay | Admitting: Family Medicine

## 2021-12-26 VITALS — BP 142/82 | HR 66 | Temp 98.5°F | Resp 16 | Ht 68.0 in | Wt 173.0 lb

## 2021-12-26 DIAGNOSIS — R7303 Prediabetes: Secondary | ICD-10-CM

## 2021-12-26 DIAGNOSIS — I10 Essential (primary) hypertension: Secondary | ICD-10-CM | POA: Diagnosis not present

## 2021-12-26 DIAGNOSIS — Z1283 Encounter for screening for malignant neoplasm of skin: Secondary | ICD-10-CM

## 2021-12-26 DIAGNOSIS — L570 Actinic keratosis: Secondary | ICD-10-CM | POA: Diagnosis not present

## 2021-12-26 DIAGNOSIS — E7800 Pure hypercholesterolemia, unspecified: Secondary | ICD-10-CM

## 2021-12-27 LAB — COMPREHENSIVE METABOLIC PANEL
ALT: 19 IU/L (ref 0–44)
AST: 22 IU/L (ref 0–40)
Albumin/Globulin Ratio: 2.6 — ABNORMAL HIGH (ref 1.2–2.2)
Albumin: 4.9 g/dL — ABNORMAL HIGH (ref 3.7–4.7)
Alkaline Phosphatase: 108 IU/L (ref 44–121)
BUN/Creatinine Ratio: 13 (ref 10–24)
BUN: 15 mg/dL (ref 8–27)
Bilirubin Total: 0.6 mg/dL (ref 0.0–1.2)
CO2: 26 mmol/L (ref 20–29)
Calcium: 10.2 mg/dL (ref 8.6–10.2)
Chloride: 103 mmol/L (ref 96–106)
Creatinine, Ser: 1.16 mg/dL (ref 0.76–1.27)
Globulin, Total: 1.9 g/dL (ref 1.5–4.5)
Glucose: 107 mg/dL — ABNORMAL HIGH (ref 70–99)
Potassium: 4.7 mmol/L (ref 3.5–5.2)
Sodium: 139 mmol/L (ref 134–144)
Total Protein: 6.8 g/dL (ref 6.0–8.5)
eGFR: 67 mL/min/{1.73_m2} (ref >59)

## 2021-12-27 LAB — CBC WITH DIFFERENTIAL/PLATELET
Basophils Absolute: 0 10*3/uL (ref 0.0–0.2)
Basos: 1 %
EOS (ABSOLUTE): 0.1 10*3/uL (ref 0.0–0.4)
Eos: 1 %
Hematocrit: 47.7 % (ref 37.5–51.0)
Hemoglobin: 16.5 g/dL (ref 13.0–17.7)
Immature Grans (Abs): 0 10*3/uL (ref 0.0–0.1)
Immature Granulocytes: 0 %
Lymphocytes Absolute: 1.4 10*3/uL (ref 0.7–3.1)
Lymphs: 22 %
MCH: 31.3 pg (ref 26.6–33.0)
MCHC: 34.6 g/dL (ref 31.5–35.7)
MCV: 91 fL (ref 79–97)
Monocytes Absolute: 0.5 10*3/uL (ref 0.1–0.9)
Monocytes: 8 %
Neutrophils Absolute: 4.3 10*3/uL (ref 1.4–7.0)
Neutrophils: 68 %
Platelets: 207 10*3/uL (ref 150–450)
RBC: 5.27 x10E6/uL (ref 4.14–5.80)
RDW: 12.2 % (ref 11.6–15.4)
WBC: 6.3 10*3/uL (ref 3.4–10.8)

## 2021-12-27 LAB — LIPID PANEL
Chol/HDL Ratio: 2.6 ratio (ref 0.0–5.0)
Cholesterol, Total: 109 mg/dL (ref 100–199)
HDL: 42 mg/dL (ref >39)
LDL Chol Calc (NIH): 54 mg/dL (ref 0–99)
Triglycerides: 60 mg/dL (ref 0–149)
VLDL Cholesterol Cal: 13 mg/dL (ref 5–40)

## 2021-12-27 LAB — HEMOGLOBIN A1C
Est. average glucose Bld gHb Est-mCnc: 117 mg/dL
Hgb A1c MFr Bld: 5.7 % — ABNORMAL HIGH (ref 4.8–5.6)

## 2021-12-27 LAB — TSH: TSH: 2.73 u[IU]/mL (ref 0.450–4.500)

## 2022-01-05 DIAGNOSIS — I10 Essential (primary) hypertension: Secondary | ICD-10-CM

## 2022-01-05 DIAGNOSIS — E7800 Pure hypercholesterolemia, unspecified: Secondary | ICD-10-CM | POA: Diagnosis not present

## 2022-01-22 DIAGNOSIS — H2511 Age-related nuclear cataract, right eye: Secondary | ICD-10-CM | POA: Diagnosis not present

## 2022-01-22 DIAGNOSIS — Z961 Presence of intraocular lens: Secondary | ICD-10-CM | POA: Diagnosis not present

## 2022-01-23 DIAGNOSIS — H2512 Age-related nuclear cataract, left eye: Secondary | ICD-10-CM | POA: Diagnosis not present

## 2022-02-05 DIAGNOSIS — Z961 Presence of intraocular lens: Secondary | ICD-10-CM | POA: Diagnosis not present

## 2022-02-05 DIAGNOSIS — H2512 Age-related nuclear cataract, left eye: Secondary | ICD-10-CM | POA: Diagnosis not present

## 2022-02-15 DIAGNOSIS — Z20822 Contact with and (suspected) exposure to covid-19: Secondary | ICD-10-CM | POA: Diagnosis not present

## 2022-03-23 ENCOUNTER — Other Ambulatory Visit: Payer: Self-pay | Admitting: Family Medicine

## 2022-03-23 DIAGNOSIS — E7800 Pure hypercholesterolemia, unspecified: Secondary | ICD-10-CM

## 2022-05-17 ENCOUNTER — Ambulatory Visit: Payer: Self-pay

## 2022-05-17 NOTE — Chronic Care Management (AMB) (Signed)
   05/17/2022  Jack Lutz 18-Aug-1951 [id number]  Documentation encounter created to complete case transition. The care management team will continue to follow for care coordination.  [facility] [phone]

## 2022-06-13 ENCOUNTER — Ambulatory Visit (INDEPENDENT_AMBULATORY_CARE_PROVIDER_SITE_OTHER): Payer: Medicare Other | Admitting: Dermatology

## 2022-06-13 DIAGNOSIS — L570 Actinic keratosis: Secondary | ICD-10-CM | POA: Diagnosis not present

## 2022-06-13 DIAGNOSIS — L739 Follicular disorder, unspecified: Secondary | ICD-10-CM

## 2022-06-13 DIAGNOSIS — I781 Nevus, non-neoplastic: Secondary | ICD-10-CM

## 2022-06-13 DIAGNOSIS — L821 Other seborrheic keratosis: Secondary | ICD-10-CM

## 2022-06-13 DIAGNOSIS — D1800 Hemangioma unspecified site: Secondary | ICD-10-CM | POA: Diagnosis not present

## 2022-06-13 DIAGNOSIS — L820 Inflamed seborrheic keratosis: Secondary | ICD-10-CM | POA: Diagnosis not present

## 2022-06-13 DIAGNOSIS — Z1283 Encounter for screening for malignant neoplasm of skin: Secondary | ICD-10-CM | POA: Diagnosis not present

## 2022-06-13 DIAGNOSIS — D229 Melanocytic nevi, unspecified: Secondary | ICD-10-CM

## 2022-06-13 DIAGNOSIS — L814 Other melanin hyperpigmentation: Secondary | ICD-10-CM

## 2022-06-13 DIAGNOSIS — L578 Other skin changes due to chronic exposure to nonionizing radiation: Secondary | ICD-10-CM | POA: Diagnosis not present

## 2022-06-13 MED ORDER — KETOCONAZOLE 2 % EX SHAM: MEDICATED_SHAMPOO | CUTANEOUS | 11 refills | Status: DC

## 2022-06-13 NOTE — Progress Notes (Signed)
Follow-Up Visit   Subjective  Jack Lutz is a 71 y.o. male who presents for the following: Annual Exam. The patient presents for Total-Body Skin Exam (TBSE) for skin cancer screening and mole check.  The patient has spots, moles and lesions to be evaluated, some may be new or changing and the patient has concerns that these could be cancer.  The following portions of the chart were reviewed this encounter and updated as appropriate:   Tobacco  Allergies  Meds  Problems  Med Hx  Surg Hx  Fam Hx     Review of Systems:  No other skin or systemic complaints except as noted in HPI or Assessment and Plan.  Objective  Well appearing patient in no apparent distress; mood and affect are within normal limits.  A full examination was performed including scalp, head, eyes, ears, nose, lips, neck, chest, axillae, abdomen, back, buttocks, bilateral upper extremities, bilateral lower extremities, hands, feet, fingers, toes, fingernails, and toenails. All findings within normal limits unless otherwise noted below.  R forehead x 2, L cheek x 1 (3) Erythematous thin papules/macules with gritty scale.   L cheek x 6, R neck x 1, L lat knee x 1 (8) Erythematous stuck-on, waxy papule or plaque  Nasal tip Dilated blood vessel.    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 Let  sit 5-10 minutes then wash off.  Chronic and persistent condition with duration or expected duration over one year. Condition is symptomatic / bothersome to patient. Not to goal.  ketoconazole (NIZORAL) 2 % shampoo - Scalp Shampoo onto the scalp let sit 5-10 minutes then wash off. Use three days per week.  Inflamed seborrheic keratosis (8) L cheek x 6, R neck x 1, L lat knee x 1 Destruction of lesion - L cheek x 6, R neck x 1, L lat knee x 1 Complexity: simple   Destruction method: cryotherapy   Informed consent: discussed and consent obtained   Timeout:  patient name, date of birth, surgical site, and procedure verified Lesion destroyed using liquid nitrogen: Yes   Region frozen until ice ball extended beyond lesion: Yes   Outcome: patient tolerated procedure well with no complications   Post-procedure details: wound care instructions given    Telangiectasia Nasal tip Discussed the treatment option of BBL/laser.  Typically we recommend 1-3 treatment sessions about 5-8 weeks apart for best results.  The patient's condition may require "maintenance treatments" in the future.  The fee for BBL / laser treatments is $350 per treatment session for the whole face.  A fee can be quoted for other parts of the body. Insurance typically does not pay for BBL/laser treatments and therefore the fee is an out-of-pocket cost. Benign-appearing.  Observation.  Call clinic for new or changing lesions.  Recommend daily use of broad spectrum spf 30+ sunscreen to sun-exposed areas.   Lentigines -  Scattered tan macules - Due to sun exposure - Benign-appearing, observe - Recommend daily broad spectrum sunscreen SPF 30+ to sun-exposed areas, reapply every 2 hours as needed. - Call for any changes  Seborrheic Keratoses - Stuck-on, waxy, tan-brown papules and/or plaques  - Benign-appearing - Discussed benign etiology and prognosis. - Observe - Call for any changes  Melanocytic Nevi - Tan-brown and/or  pink-flesh-colored symmetric macules and papules - Benign appearing on exam today - Observation - Call clinic for new or changing moles - Recommend daily use of broad spectrum spf 30+ sunscreen to sun-exposed areas.   Hemangiomas - Red papules - Discussed benign nature - Observe - Call for any changes  Actinic Damage - Chronic condition, secondary to cumulative UV/sun exposure - diffuse scaly erythematous macules with underlying dyspigmentation - Recommend daily broad spectrum sunscreen SPF 30+ to sun-exposed areas, reapply every 2 hours as needed.  - Staying in the shade or wearing long sleeves, sun glasses (UVA+UVB protection) and wide brim hats (4-inch brim around the entire circumference of the hat) are also recommended for sun protection.  - Call for new or changing lesions.  Skin cancer screening performed today.  Return in about 1 year (around 06/14/2023) for TBSE.  Luther Redo, CMA, am acting as scribe for Sarina Ser, MD . Documentation: I have reviewed the above documentation for accuracy and completeness, and I agree with the above.  Sarina Ser, MD

## 2022-06-13 NOTE — Patient Instructions (Signed)
Due to recent changes in healthcare laws, you may see results of your pathology and/or laboratory studies on MyChart before the doctors have had a chance to review them. We understand that in some cases there may be results that are confusing or concerning to you. Please understand that not all results are received at the same time and often the doctors may need to interpret multiple results in order to provide you with the best plan of care or course of treatment. Therefore, we ask that you please give us 2 business days to thoroughly review all your results before contacting the office for clarification. Should we see a critical lab result, you will be contacted sooner.   If You Need Anything After Your Visit  If you have any questions or concerns for your doctor, please call our main line at [phone] and press option 4 to reach your doctor's medical assistant. If no one answers, please leave a voicemail as directed and we will return your call as soon as possible. Messages left after 4 pm will be answered the following business day.   You may also send us a message via MyChart. We typically respond to MyChart messages within 1-2 business days.  For prescription refills, please ask your pharmacy to contact our office. Our fax number is [phone].  If you have an urgent issue when the clinic is closed that cannot wait until the next business day, you can page your doctor at the number below.    Please note that while we do our best to be available for urgent issues outside of office hours, we are not available 24/7.   If you have an urgent issue and are unable to reach us, you may choose to seek medical care at your doctor's office, retail clinic, urgent care center, or emergency room.  If you have a medical emergency, please immediately call 911 or go to the emergency department.  Pager Numbers  - Dr. Kowalski: [phone]  - Dr. Moye: [phone]  - Dr. Stewart:  [phone]  In the event of inclement weather, please call our main line at [phone] for an update on the status of any delays or closures.  Dermatology Medication Tips: Please keep the boxes that topical medications come in in order to help keep track of the instructions about where and how to use these. Pharmacies typically print the medication instructions only on the boxes and not directly on the medication tubes.   If your medication is too expensive, please contact our office at [phone] option 4 or send us a message through MyChart.   We are unable to tell what your co-pay for medications will be in advance as this is different depending on your insurance coverage. However, we may be able to find a substitute medication at lower cost or fill out paperwork to get insurance to cover a needed medication.   If a prior authorization is required to get your medication covered by your insurance company, please allow us 1-2 business days to complete this process.  Drug prices often vary depending on where the prescription is filled and some pharmacies may offer cheaper prices.  The website www.goodrx.com contains coupons for medications through different pharmacies. The prices here do not account for what the cost may be with help from insurance (it may be cheaper with your insurance), but the website can give you the price if you did not use any insurance.  - You can print the associated coupon and take it with   your prescription to the pharmacy.  - You may also stop by our office during regular business hours and pick up a GoodRx coupon card.  - If you need your prescription sent electronically to a different pharmacy, notify our office through [facility] MyChart or by phone at [phone] option 4.     Si Usted Necesita Algo Despus de Su Visita  Tambin puede enviarnos un mensaje a travs de MyChart. Por lo general respondemos a los mensajes de MyChart en el transcurso de 1 a 2  das hbiles.  Para renovar recetas, por favor pida a su farmacia que se ponga en contacto con nuestra oficina. Nuestro nmero de fax es el [phone].  Si tiene un asunto urgente cuando la clnica est cerrada y que no puede esperar hasta el siguiente da hbil, puede llamar/localizar a su doctor(a) al nmero que aparece a continuacin.   Por favor, tenga en cuenta que aunque hacemos todo lo posible para estar disponibles para asuntos urgentes fuera del horario de oficina, no estamos disponibles las 24 horas del da, los 7 das de la semana.   Si tiene un problema urgente y no puede comunicarse con nosotros, puede optar por buscar atencin mdica  en el consultorio de su doctor(a), en una clnica privada, en un centro de atencin urgente o en una sala de emergencias.  Si tiene una emergencia mdica, por favor llame inmediatamente al 911 o vaya a la sala de emergencias.  Nmeros de bper  - Dr. Kowalski: [phone]  - Dra. Moye: [phone]  - Dra. Stewart: [phone]  En caso de inclemencias del tiempo, por favor llame a nuestra lnea principal al [phone] para una actualizacin sobre el estado de cualquier retraso o cierre.  Consejos para la medicacin en dermatologa: Por favor, guarde las cajas en las que vienen los medicamentos de uso tpico para ayudarle a seguir las instrucciones sobre dnde y cmo usarlos. Las farmacias generalmente imprimen las instrucciones del medicamento slo en las cajas y no directamente en los tubos del medicamento.   Si su medicamento es muy caro, por favor, pngase en contacto con nuestra oficina llamando al [phone] y presione la opcin 4 o envenos un mensaje a travs de MyChart.   No podemos decirle cul ser su copago por los medicamentos por adelantado ya que esto es diferente dependiendo de la cobertura de su seguro. Sin embargo, es posible que podamos encontrar un medicamento sustituto a menor costo o llenar un formulario para que el  seguro cubra el medicamento que se considera necesario.   Si se requiere una autorizacin previa para que su compaa de seguros cubra su medicamento, por favor permtanos de 1 a 2 das hbiles para completar este proceso.  Los precios de los medicamentos varan con frecuencia dependiendo del lugar de dnde se surte la receta y alguna farmacias pueden ofrecer precios ms baratos.  El sitio web www.goodrx.com tiene cupones para medicamentos de diferentes farmacias. Los precios aqu no tienen en cuenta lo que podra costar con la ayuda del seguro (puede ser ms barato con su seguro), pero el sitio web puede darle el precio si no utiliz ningn seguro.  - Puede imprimir el cupn correspondiente y llevarlo con su receta a la farmacia.  - Tambin puede pasar por nuestra oficina durante el horario de atencin regular y recoger una tarjeta de cupones de GoodRx.  - Si necesita que su receta se enve electrnicamente a una farmacia diferente, informe a nuestra oficina a travs de MyChart de [facility]   o por telfono llamando al [phone] y presione la opcin 4.  

## 2022-06-17 ENCOUNTER — Encounter: Payer: Self-pay | Admitting: Dermatology

## 2022-06-25 DIAGNOSIS — Z23 Encounter for immunization: Secondary | ICD-10-CM | POA: Diagnosis not present

## 2022-07-05 DIAGNOSIS — Z23 Encounter for immunization: Secondary | ICD-10-CM | POA: Diagnosis not present

## 2022-09-27 ENCOUNTER — Telehealth: Payer: Medicare Other | Admitting: Family Medicine

## 2022-09-27 ENCOUNTER — Ambulatory Visit: Payer: Self-pay

## 2022-09-27 DIAGNOSIS — U071 COVID-19: Secondary | ICD-10-CM

## 2022-09-27 MED ORDER — NIRMATRELVIR/RITONAVIR (PAXLOVID)TABLET: 3.0000 | ORAL_TABLET | Freq: Two times a day (BID) | ORAL | 0 refills | Status: AC

## 2022-09-27 NOTE — Telephone Encounter (Signed)
Pt was scheduled for virtual visit.

## 2022-09-27 NOTE — Telephone Encounter (Signed)
Patient's wife called back- she really wants patient seen so he can get started on treatment- I have scheduled virtual visit with [facility]- 3:45 this afternoon.

## 2022-09-27 NOTE — Progress Notes (Signed)
Virtual Visit Consent   Jack Lutz, you are scheduled for a virtual visit with a Jack Lutz provider today. Just as with appointments in the office, your consent must be obtained to participate. Your consent will be active for this visit and any virtual visit you may have with one of our providers in the next 365 days. If you have a MyChart account, a copy of this consent can be sent to you electronically.  As this is a virtual visit, video technology does not allow for your provider to perform a traditional examination. This may limit your provider's ability to fully assess your condition. If your provider identifies any concerns that need to be evaluated in person or the need to arrange testing (such as labs, EKG, etc.), we will make arrangements to do so. Although advances in technology are sophisticated, we cannot ensure that it will always work on either your end or our end. If the connection with a video visit is poor, the visit may have to be switched to a telephone visit. With either a video or telephone visit, we are not always able to ensure that we have a secure connection.  By engaging in this virtual visit, you consent to the provision of healthcare and authorize for your insurance to be billed (if applicable) for the services provided during this visit. Depending on your insurance coverage, you may receive a charge related to this service.  I need to obtain your verbal consent now. Are you willing to proceed with your visit today? Jack Lutz has provided verbal consent on 09/27/2022 for a virtual visit (video or telephone). Jack Nims, FNP  Date: 09/27/2022 3:36 PM  Virtual Visit via Video Note   I, Jack Lutz, connected with  Jack Lutz  ([id number], [birth year]) on 09/27/22 at  3:45 PM EST by a video-enabled telemedicine application and verified that I am speaking with the correct person using two identifiers.  Location: Patient: Virtual Visit Location Patient:  Home Provider: Virtual Visit Location Provider: Home Office   I discussed the limitations of evaluation and management by telemedicine and the availability of in person appointments. The patient expressed understanding and agreed to proceed.    History of Present Illness: Jack Lutz is a 71 y.o. who identifies as a male who was assigned male at birth, and is being seen today for positive covid test at home today with sx starting yesterday. His wife has covid. He has fever, aches, chills, cough and headache. No wheezing or sob. In no distress. Family member present. 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 (Take nirmatrelvir 150 mg two tablets twice daily for 5 days and ritonavir 100 mg one tablet twice daily for 5 days) Patient GFR is normal, Disp: 30 tablet, Rfl: 0   aspirin 81 MG tablet, Take 81 mg by mouth daily. , Disp: , Rfl:    atorvastatin (LIPITOR) 40 MG tablet,  TAKE 1 TABLET BY MOUTH EVERY DAY, Disp: 90 tablet, Rfl: 3   cetirizine (ZYRTEC) 10 MG tablet, Take 10 mg by mouth daily., Disp: , Rfl:    Cholecalciferol (VITAMIN D) 2000 UNITS tablet, Take 2,000 Units by mouth daily. , Disp: , Rfl:    CINNAMON PO, Take 1,000 mg by mouth daily. , Disp: , Rfl:    Coenzyme Q10 (CO Q-10) 100 MG CAPS, Take 200 mg by mouth daily. , Disp: , Rfl:    FLUAD 0.5 ML SUSY, inject 0.5 milliliter intramuscularly, Disp: , Rfl:  0   fluticasone (FLONASE) 50 MCG/ACT nasal spray, instill 2 sprays into each nostril once daily, Disp: 16 g, Rfl: 12   ketoconazole (NIZORAL) 2 % shampoo, Shampoo onto the scalp let sit 5-10 minutes then wash off. Use three days per week., Disp: 120 mL, Rfl: 11   ketorolac (ACULAR) 0.5 % ophthalmic solution, Place 1 drop into the right eye 4 (four) times daily. (Patient not taking: Reported on 12/25/2021), Disp: , Rfl:    lisinopril (ZESTRIL) 40 MG tablet, TAKE 1 TABLET BY MOUTH EVERY DAY, Disp: 90 tablet, Rfl: 3   loratadine (CLARITIN) 10 MG tablet, Take 10 mg by mouth daily.  (Patient not taking: Reported on 12/18/2021), Disp: , Rfl:    moxifloxacin (VIGAMOX) 0.5 % ophthalmic solution, Place 1 drop into the right eye 4 (four) times daily. (Patient not taking: Reported on 12/25/2021), Disp: , Rfl:    prednisoLONE acetate (PRED FORTE) 1 % ophthalmic suspension, Place 1 drop into the left eye 4 (four) times daily. (Patient not taking: Reported on 12/25/2021), Disp: , Rfl:    vitamin C (ASCORBIC ACID) 500 MG tablet, Take 500 mg by mouth daily., Disp: , Rfl:   Observations/Objective: Patient is well-developed, well-nourished in no acute distress.  Resting comfortably  at home.  Head is normocephalic, atraumatic.  No labored breathing.  Speech is clear and coherent with logical content.  Patient is alert and oriented at baseline.    Assessment and Plan: 1. COVID-19  Increase fluids, humidifier at night, tylenol or ibuprofen as directed, urgent care if sx worsen, MVI with Vit d and zinc.  Follow Up Instructions: I discussed the assessment and treatment plan with the patient. The patient was provided an opportunity to ask questions and all were answered. The patient agreed with the plan and demonstrated an understanding of the instructions.  A copy of instructions were sent to the patient via MyChart unless otherwise noted below.     The patient was advised to call back or seek an in-person  evaluation if the symptoms worsen or if the condition fails to improve as anticipated.  Time:  I spent 10 minutes with the patient via telehealth technology discussing the above problems/concerns.    Jack Nims, FNP

## 2022-09-27 NOTE — Telephone Encounter (Signed)
Pt wife stated pt tested positive for COVID this morning. Stated throat is raspy cough has started and has some congestion.  Requesting Paxlovid.   Chief Complaint: Tested positive today for COVID, wife positive this week. Asking for Paxlovid without OV. Symptoms: Cough, headache,sore throat Frequency: Yesterday Pertinent Negatives: Patient denies Fever Disposition: '[]'$ ED /'[]'$ Urgent Care (no appt availability in office) / '[]'$ Appointment(In office/virtual)/ '[]'$  [facility]/ '[]'$ Home Care/ '[]'$ Refused Recommended Disposition /'[]'$ [facility]/ '[x]'$  Follow-up with PCP Additional Notes: Please advise pt.  Answer Assessment - Initial Assessment Questions 1. COVID-19 DIAGNOSIS: "How do you know that you have COVID?" (e.g., positive lab test or self-test, diagnosed by doctor or NP/PA, symptoms after exposure).     hOME test 2. COVID-19 EXPOSURE: "Was there any known exposure to COVID before the symptoms began?" CDC Definition of close contact: within 6 feet (2 meters) for a total of 15 minutes or more over a 24-hour period.      Yes 3. ONSET: "When did the COVID-19 symptoms start?"      Last night 4. WORST SYMPTOM: "What is your worst symptom?" (e.g., cough, fever, shortness of breath, muscle aches)     Cough, headache 5. COUGH: "Do you have a cough?" If Yes, ask: "How bad is the cough?"       Yes 6. FEVER: "Do you have a fever?" If Yes, ask: "What is your temperature, how was it measured, and when did it start?"     No 7. RESPIRATORY STATUS: "Describe your breathing?" (e.g., normal; shortness of breath, wheezing, unable to speak)      No 8. BETTER-SAME-WORSE: "Are you getting better, staying the same or getting worse compared to yesterday?"  If getting worse, ask, "In what way?"     Worse 9. OTHER SYMPTOMS: "Do you have any other symptoms?"  (e.g., chills, fatigue, headache, loss of smell or taste, muscle pain, sore throat)     Headache, sore throat 10. HIGH RISK DISEASE: "Do you  have any chronic medical problems?" (e.g., asthma, heart or lung disease, weak immune system, obesity, etc.)       Yes 11. VACCINE: "Have you had the COVID-19 vaccine?" If Yes, ask: "Which one, how many shots, when did you get it?"       N/a 12. PREGNANCY: "Is there any chance you are pregnant?" "When was your last menstrual period?"       N/a 13. O2 SATURATION MONITOR:  "Do you use an oxygen saturation monitor (pulse oximeter) at home?" If Yes, ask "What is your reading (oxygen level) today?" "What is your usual oxygen saturation reading?" (e.g., 95%)       No  Protocols used: Coronavirus (COVID-19) Diagnosed or Suspected-A-AH

## 2022-09-27 NOTE — Telephone Encounter (Signed)
Please advise 

## 2022-09-27 NOTE — Patient Instructions (Signed)
Quarantine and Isolation Quarantine and isolation refer to local and travel restrictions to protect the public and travelers from contagious diseases that constitute a public health threat. Contagious diseases are diseases that can spread from one person to another. Quarantine and isolation help to protect the public by preventing exposure to people who have or may have a contagious disease. Isolation separates people who are sick with a contagious disease from people who are not sick. Quarantine separates and restricts the movement of people who were exposed to a contagious disease to see if they become sick. You may be put in quarantine or isolation if you have been exposed to or diagnosed with any of the following diseases: Severe acute respiratory syndromes, such as COVID-19. Cholera. Diphtheria. Tuberculosis. Plague. Smallpox. Yellow fever. Viral hemorrhagic fevers, such as [place], Ebola, and [place]. When to quarantine or isolate Follow these rules, whether you have been vaccinated or not: Stay home and isolate from others when you are sick with a contagious disease. Isolate when you test positive for a contagious disease, even if you do not have symptoms. Isolate if you are sick and suspect that you may have a contagious disease. If you suspect that you have a contagious disease, get tested. If your test results are negative, you can end your isolation. If your test results are positive, follow the full isolation recommendations as told by your health care provider or local health authorities. Quarantine and stay away from others when you have been in close contact with someone who has tested positive for a contagious disease. Close contact is defined as being less than 6 ft (1.8 m) away from an infected person for a total of 15 minutes or more over a 24-hour period. Do not go to places where you are unable to wear a mask, such as restaurants and some gyms. Stay home and separate  from others as much as possible. Avoid being around people who may get very sick from the contagious disease that you have. Use a separate bathroom, if possible. Do not travel. For travel guidance, visit the CDC's travel webpage at wwwnc.cdc.gov/travel/ Follow these instructions at home: Medicines  Take over-the-counter and prescription medicines as told by your health care provider. Finish all antibiotic medicine even when you start to feel better. Stay up to date with all your vaccines. Get scheduled vaccines and boosters as recommended by your health care provider. Lifestyle Wear a high-quality mask if you must be around others at home and in public, if recommended. Improve air flow (ventilation) at home to help prevent the disease from spreading to other people, if possible. Do not share personal household items, like cups, towels, and utensils. Practice everyday hygiene and cleaning. General instructions Talk to your health care provider if you have a weakened body defense system (immune system). People with a weakened immune system may have a reduced immune response to vaccines. You may need to follow current prevention measures, including wearing a well-fitting mask, avoiding crowds, and avoiding poorly ventilated indoor places. Monitor symptoms and follow health care provider instructions, which may include resting, drinking fluids, and taking medicines. Follow specific isolation and quarantine recommendations if you are in places that can lead to disease outbreaks, such as correctional and detention facilities, homeless shelters, and cruise ships. Return to your normal activities as told by your health care provider. Ask your health care provider what activities are safe for you. Keep all follow-up visits. This is important. Where to find more information CDC: www.cdc.gov/quarantine/index.html Contact   a health care provider if: You have a fever. You have signs and symptoms that  return or get worse after isolation. Get help right away if: You have difficulty breathing. You have chest pain. These symptoms may be an emergency. Get help right away. Call 911. Do not wait to see if the symptoms will go away. Do not drive yourself to the hospital. Summary Isolation and quarantine help protect the public by preventing exposure to people who have or may have a contagious disease. Isolate when you are sick or when you test positive, even if you do not have symptoms. Quarantine and stay away from others when you have been in close contact with someone who has tested positive for a contagious disease. This information is not intended to replace advice given to you by your health care provider. Make sure you discuss any questions you have with your health care provider. Document Revised: 10/05/2021 Document Reviewed: 09/14/2021 Elsevier Patient Education  [place] QIONG-29, or coronavirus disease 2019, is an infection that is caused by a new (novel) coronavirus called SARS-CoV-2. COVID-19 can cause many symptoms. In some people, the virus may not cause any symptoms. In others, it may cause mild or severe symptoms. Some people with severe infection develop severe disease. What are the causes? This illness is caused by a virus. The virus may be in the air as tiny specks of fluid (aerosols) or droplets, or it may be on surfaces. You may catch the virus by: Breathing in droplets from an infected person. Droplets can be spread by a person breathing, speaking, singing, coughing, or sneezing. Touching something, like a table or a doorknob, that has virus on it (is contaminated) and then touching your mouth, nose, or eyes. What increases the risk? Risk for infection: You are more likely to get infected with the COVID-19 virus if: You are within 6 ft (1.8 m) of a person with COVID-19 for 15 minutes or longer. You are providing care for a person who is infected with  COVID-19. You are in close personal contact with other people. Close personal contact includes hugging, kissing, or sharing eating or drinking utensils. Risk for serious illness caused by COVID-19: You are more likely to get seriously ill from the COVID-19 virus if: You have cancer. You have a long-term (chronic) disease, such as: Chronic lung disease. This includes pulmonary embolism, chronic obstructive pulmonary disease, and cystic fibrosis. Long-term disease that lowers your body's ability to fight infection (immunocompromise). Serious cardiac conditions, such as heart failure, coronary artery disease, or cardiomyopathy. Diabetes. Chronic kidney disease. Liver diseases. These include cirrhosis, nonalcoholic fatty liver disease, alcoholic liver disease, or autoimmune hepatitis. You have obesity. You are pregnant or were recently pregnant. You have sickle cell disease. What are the signs or symptoms? Symptoms of this condition can range from mild to severe. Symptoms may appear any time from 2 to 14 days after being exposed to the virus. They include: Fever or chills. Shortness of breath or trouble breathing. Feeling tired or very tired. Headaches, body aches, or muscle aches. Runny or stuffy nose, sneezing, coughing, or sore throat. New loss of taste or smell. This is rare. Some people may also have stomach problems, such as nausea, vomiting, or diarrhea. Other people may not have any symptoms of COVID-19. How is this diagnosed? This condition may be diagnosed by testing samples to check for the COVID-19 virus. The most common tests are the PCR test and the antigen test. Tests may be done  in the lab or at home. They include: Using a swab to take a sample of fluid from the back of your nose and throat (nasopharyngeal fluid), from your nose, or from your throat. Testing a sample of saliva from your mouth. Testing a sample of coughed-up mucus from your lungs (sputum). How is this  treated? Treatment for COVID-19 infection depends on the severity of the condition. Mild symptoms can be managed at home with rest, fluids, and over-the-counter medicines. Serious symptoms may be treated in a hospital intensive care unit (ICU). Treatment in the ICU may include: Supplemental oxygen. Extra oxygen is given through a tube in the nose, a face mask, or a hood. Medicines. These may include: Antivirals, such as monoclonal antibodies. These help your body fight off certain viruses that can cause disease. Anti-inflammatories, such as corticosteroids. These reduce inflammation and suppress the immune system. Antithrombotics. These prevent or treat blood clots, if they develop. Convalescent plasma. This helps boost your immune system, if you have an underlying immunosuppressive condition or are getting immunosuppressive treatments. Prone positioning. This means you will lie on your stomach. This helps oxygen to get into your lungs. Infection control measures. If you are at risk for more serious illness caused by COVID-19, your health care provider may prescribe two long-acting monoclonal antibodies, given together every 6 months. How is this prevented? To protect yourself: Use preventive medicine (pre-exposure prophylaxis). You may get pre-exposure prophylaxis if you have moderate or severe immunocompromise. Get vaccinated. Anyone 48 months old or older who meets guidelines can get a COVID-19 vaccine or vaccine series. This includes people who are pregnant or making breast milk (lactating). Get an added dose of COVID-19 vaccine after your first vaccine or vaccine series if you have moderate to severe immunocompromise. This applies if you have had a solid organ transplant or have been diagnosed with an immunocompromising condition. You should get the added dose 4 weeks after you got the first COVID-19 vaccine or vaccine series. If you get an mRNA vaccine, you will need a 3-dose primary  series. If you get the J&J/Janssen vaccine, you will need a 2-dose primary series, with the second dose being an mRNA vaccine. Talk to your health care provider about getting experimental monoclonal antibodies. This treatment is approved under emergency use authorization to prevent severe illness before or after being exposed to the COVID-19 virus. You may be given monoclonal antibodies if: You have moderate or severe immunocompromise. This includes treatments that lower your immune response. People with immunocompromise may not develop protection against COVID-19 when they are vaccinated. You cannot be vaccinated. You may not get a vaccine if you have a severe allergic reaction to the vaccine or its components. You are not fully vaccinated. You are in a facility where COVID-19 is present and: Are in close contact with a person who is infected with the COVID-19 virus. Are at high risk of being exposed to the COVID-19 virus. You are at risk of illness from new variants of the COVID-19 virus. To protect others: If you have symptoms of COVID-19, take steps to prevent the virus from spreading to others. Stay home. Leave your house only to get medical care. Do not use public transit, if possible. Do not travel while you are sick. Wash your hands often with soap and water for at least 20 seconds. If soap and water are not available, use alcohol-based hand sanitizer. Make sure that all people in your household wash their hands well and often. Cough or sneeze  into a tissue or your sleeve or elbow. Do not cough or sneeze into your hand or into the air. Where to find more information Centers for Disease Control and Prevention: CharmC[employer].be World Health Organization: https://www.castaneda.info/ Get help right away if: You have trouble breathing. You have pain or pressure in your chest. You are confused. You have bluish lips and fingernails. You have trouble waking from sleep. You  have symptoms that get worse. These symptoms may be an emergency. Get help right away. Call 911. Do not wait to see if the symptoms will go away. Do not drive yourself to the hospital. Summary COVID-19 is an infection that is caused by a new coronavirus. Sometimes, there are no symptoms. Other times, symptoms range from mild to severe. Some people with a severe COVID-19 infection develop severe disease. The virus that causes COVID-19 can spread from person to person through droplets or aerosols from breathing, speaking, singing, coughing, or sneezing. Mild symptoms of COVID-19 can be managed at home with rest, fluids, and over-the-counter medicines. This information is not intended to replace advice given to you by your health care provider. Make sure you discuss any questions you have with your health care provider. Document Revised: 09/12/2021 Document Reviewed: 09/14/2021 Elsevier Patient Education  [place].

## 2022-10-10 ENCOUNTER — Telehealth: Payer: Self-pay

## 2022-10-10 NOTE — Telephone Encounter (Signed)
Copied from [place] [id number]. Topic: General - Other >> Oct 10, 2022 12:20 PM Sabas Sous wrote: Reason for CRM: Pt's wife called to share new insurance information:  Member ID: [id number]  Group Number: [id number]  Claims Zip Code: [id number]

## 2023-01-01 ENCOUNTER — Encounter: Payer: Medicare Other | Admitting: Family Medicine

## 2023-02-07 DIAGNOSIS — E7800 Pure hypercholesterolemia, unspecified: Secondary | ICD-10-CM | POA: Diagnosis not present

## 2023-02-07 DIAGNOSIS — I10 Essential (primary) hypertension: Secondary | ICD-10-CM | POA: Diagnosis not present

## 2023-02-07 DIAGNOSIS — J301 Allergic rhinitis due to pollen: Secondary | ICD-10-CM | POA: Diagnosis not present

## 2023-02-07 DIAGNOSIS — R7303 Prediabetes: Secondary | ICD-10-CM | POA: Diagnosis not present

## 2023-02-07 DIAGNOSIS — L709 Acne, unspecified: Secondary | ICD-10-CM | POA: Diagnosis not present

## 2023-02-07 DIAGNOSIS — Z0000 Encounter for general adult medical examination without abnormal findings: Secondary | ICD-10-CM | POA: Diagnosis not present

## 2023-02-14 DIAGNOSIS — H52221 Regular astigmatism, right eye: Secondary | ICD-10-CM | POA: Diagnosis not present

## 2023-02-14 DIAGNOSIS — Z9841 Cataract extraction status, right eye: Secondary | ICD-10-CM | POA: Diagnosis not present

## 2023-02-14 DIAGNOSIS — Z83511 Family history of glaucoma: Secondary | ICD-10-CM | POA: Diagnosis not present

## 2023-02-14 DIAGNOSIS — Z961 Presence of intraocular lens: Secondary | ICD-10-CM | POA: Diagnosis not present

## 2023-02-14 DIAGNOSIS — Z9842 Cataract extraction status, left eye: Secondary | ICD-10-CM | POA: Diagnosis not present

## 2023-06-14 DIAGNOSIS — Z23 Encounter for immunization: Secondary | ICD-10-CM | POA: Diagnosis not present

## 2023-06-28 DIAGNOSIS — Z23 Encounter for immunization: Secondary | ICD-10-CM | POA: Diagnosis not present

## 2023-07-10 ENCOUNTER — Encounter: Payer: Self-pay | Admitting: Dermatology

## 2023-07-10 ENCOUNTER — Ambulatory Visit: Payer: Medicare Other | Admitting: Dermatology

## 2023-07-10 VITALS — BP 138/73

## 2023-07-10 DIAGNOSIS — L821 Other seborrheic keratosis: Secondary | ICD-10-CM | POA: Diagnosis not present

## 2023-07-10 DIAGNOSIS — L719 Rosacea, unspecified: Secondary | ICD-10-CM | POA: Diagnosis not present

## 2023-07-10 DIAGNOSIS — L820 Inflamed seborrheic keratosis: Secondary | ICD-10-CM

## 2023-07-10 DIAGNOSIS — L814 Other melanin hyperpigmentation: Secondary | ICD-10-CM

## 2023-07-10 DIAGNOSIS — D1801 Hemangioma of skin and subcutaneous tissue: Secondary | ICD-10-CM | POA: Diagnosis not present

## 2023-07-10 DIAGNOSIS — W908XXA Exposure to other nonionizing radiation, initial encounter: Secondary | ICD-10-CM | POA: Diagnosis not present

## 2023-07-10 DIAGNOSIS — Z79899 Other long term (current) drug therapy: Secondary | ICD-10-CM | POA: Diagnosis not present

## 2023-07-10 DIAGNOSIS — L570 Actinic keratosis: Secondary | ICD-10-CM | POA: Diagnosis not present

## 2023-07-10 DIAGNOSIS — Z1283 Encounter for screening for malignant neoplasm of skin: Secondary | ICD-10-CM

## 2023-07-10 DIAGNOSIS — L578 Other skin changes due to chronic exposure to nonionizing radiation: Secondary | ICD-10-CM | POA: Diagnosis not present

## 2023-07-10 DIAGNOSIS — L739 Follicular disorder, unspecified: Secondary | ICD-10-CM | POA: Diagnosis not present

## 2023-07-10 DIAGNOSIS — Z872 Personal history of diseases of the skin and subcutaneous tissue: Secondary | ICD-10-CM

## 2023-07-10 DIAGNOSIS — Z7189 Other specified counseling: Secondary | ICD-10-CM

## 2023-07-10 DIAGNOSIS — D229 Melanocytic nevi, unspecified: Secondary | ICD-10-CM

## 2023-07-10 MED ORDER — ORACEA 40 MG PO CPDR: 1.0000 | DELAYED_RELEASE_CAPSULE | Freq: Every day | ORAL | 6 refills | Status: DC

## 2023-07-10 MED ORDER — KETOCONAZOLE 2 % EX SHAM: 1.0000 | MEDICATED_SHAMPOO | CUTANEOUS | 11 refills | Status: DC

## 2023-07-10 MED ORDER — KETOCONAZOLE 2 % EX SHAM: 1.0000 | MEDICATED_SHAMPOO | CUTANEOUS | 0 refills | Status: DC

## 2023-07-10 NOTE — Patient Instructions (Addendum)
Cryotherapy Aftercare  Wash gently with soap and water everyday.   Apply Vaseline and Band-Aid daily until healed.    Folliculitis occurs due to inflammation of the superficial hair follicle (pore), resulting in acne-like lesions (pus bumps). It can be infectious (bacterial, fungal) or noninfectious (shaving, tight clothing, heat/sweat, medications).  Folliculitis can be acute or chronic and recommended treatment depends on the underlying cause of folliculitis.    Rosacea is a chronic progressive skin condition usually affecting the face of adults, causing redness and/or acne bumps. It is treatable but not curable. It sometimes affects the eyes (ocular rosacea) as well. It may respond to topical and/or systemic medication and can flare with stress, sun exposure, alcohol, exercise, topical steroids (including hydrocortisone/cortisone 10) and some foods.  Daily application of broad spectrum spf 30+ sunscreen to face is recommended to reduce flares.    Due to recent changes in healthcare laws, you may see results of your pathology and/or laboratory studies on MyChart before the doctors have had a chance to review them. We understand that in some cases there may be results that are confusing or concerning to you. Please understand that not all results are received at the same time and often the doctors may need to interpret multiple results in order to provide you with the best plan of care or course of treatment. Therefore, we ask that you please give [place] 2 business days to thoroughly review all your results before contacting the office for clarification. Should we see a critical lab result, you will be contacted sooner.   If You Need Anything After Your Visit  If you have any questions or concerns for your doctor, please call our main line at [phone] and press option 4 to reach your doctor's medical assistant. If no one answers, please leave a voicemail as directed and we will return your call as  soon as possible. Messages left after 4 pm will be answered the following business day.   You may also send [place] a message via MyChart. We typically respond to MyChart messages within 1-2 business days.  For prescription refills, please ask your pharmacy to contact our office. Our fax number is [phone].  If you have an urgent issue when the clinic is closed that cannot wait until the next business day, you can page your doctor at the number below.    Please note that while we do our best to be available for urgent issues outside of office hours, we are not available 24/7.   If you have an urgent issue and are unable to reach [place], you may choose to seek medical care at your doctor's office, retail clinic, urgent care center, or emergency room.  If you have a medical emergency, please immediately call 911 or go to the emergency department.  Pager Numbers  - Dr. Gwen Pounds: [phone]  - Dr. Roseanne Reno: [phone]  - Dr. Katrinka Blazing: [phone]   In the event of inclement weather, please call our main line at [phone] for an update on the status of any delays or closures.  Dermatology Medication Tips: Please keep the boxes that topical medications come in in order to help keep track of the instructions about where and how to use these. Pharmacies typically print the medication instructions only on the boxes and not directly on the medication tubes.   If your medication is too expensive, please contact our office at [phone] option 4 or send [place] a message through MyChart.   We are unable to tell  what your co-pay for medications will be in advance as this is different depending on your insurance coverage. However, we may be able to find a substitute medication at lower cost or fill out paperwork to get insurance to cover a needed medication.   If a prior authorization is required to get your medication covered by your insurance company, please allow [place] 1-2 business days to complete this  process.  Drug prices often vary depending on where the prescription is filled and some pharmacies may offer cheaper prices.  The website www.goodrx.com contains coupons for medications through different pharmacies. The prices here do not account for what the cost may be with help from insurance (it may be cheaper with your insurance), but the website can give you the price if you did not use any insurance.  - You can print the associated coupon and take it with your prescription to the pharmacy.  - You may also stop by our office during regular business hours and pick up a GoodRx coupon card.  - If you need your prescription sent electronically to a different pharmacy, notify our office through [facility] or by phone at [phone] option 4.     Si Usted Necesita Algo Despus de Su Visita  Tambin puede enviarnos un mensaje a travs de Clinical cytogeneticist. Por lo general respondemos a los mensajes de MyChart en el transcurso de 1 a 2 das hbiles.  Para renovar recetas, por favor pida a su farmacia que se ponga en contacto con nuestra oficina. Annie Sable de fax es [place] [phone].  Si tiene un asunto urgente cuando la clnica est cerrada y que no puede esperar hasta el siguiente da hbil, puede llamar/localizar a su doctor(a) al nmero que aparece a continuacin.   Por favor, tenga en cuenta que aunque hacemos todo lo posible para estar disponibles para asuntos urgentes fuera del horario de [place], no estamos disponibles las 24 horas del da, los 7 [address]  [address] de la [place].   Si tiene un problema urgente y no puede comunicarse con nosotros, puede optar por buscar atencin mdica  en el consultorio de su doctor(a), en una clnica privada, en un centro de atencin urgente o en una sala de emergencias.  Si tiene Engineer, drilling, por favor llame inmediatamente al 911 o vaya a la sala de emergencias.  Nmeros de bper  - Dr. Gwen Pounds: [phone]  - Dra. Roseanne Reno: [phone]  - Dr.  Katrinka Blazing: [phone]   En caso de inclemencias del tiempo, por favor llame a Lacy Duverney principal al [phone] para una actualizacin sobre el [place] de cualquier retraso o cierre.  Consejos para la medicacin en dermatologa: Por favor, guarde las cajas en las que vienen los medicamentos de uso tpico para ayudarle a seguir las instrucciones sobre dnde y cmo usarlos. Las farmacias generalmente imprimen las instrucciones del medicamento slo en las cajas y no directamente en los tubos del [place].   Si su medicamento es muy caro, por favor, pngase en contacto con Rolm Gala llamando al [phone] y presione la opcin 4 o envenos un mensaje a travs de Clinical cytogeneticist.   No podemos decirle cul ser su copago por los medicamentos por adelantado ya que esto es diferente dependiendo de la cobertura de su seguro. Sin embargo, es posible que podamos encontrar un medicamento sustituto a Audiological scientist un formulario para que el seguro cubra el medicamento que se considera necesario.   Si se requiere una autorizacin previa para que su compaa de seguros [place]  su medicamento, por favor permtanos de 1 a 2 das hbiles para completar [address]  Los precios de los medicamentos varan con frecuencia dependiendo del Environmental consultant de dnde se surte la receta y alguna farmacias pueden ofrecer precios ms baratos.  El sitio web www.goodrx.com tiene cupones para medicamentos de Health and safety inspector. Los precios aqu no tienen en cuenta lo que podra costar con la ayuda del seguro (puede ser ms barato con su seguro), pero el sitio web puede darle el precio si no utiliz Tourist information centre manager.  - Puede imprimir el cupn correspondiente y llevarlo con su receta a la farmacia.  - Tambin puede pasar por nuestra oficina durante el horario de atencin regular y Education officer, museum una tarjeta de cupones de GoodRx.  - Si necesita que su receta se enve electrnicamente a una farmacia diferente, informe a nuestra oficina a  travs de MyChart de [place] o por telfono llamando al [phone] y presione la opcin 4.

## 2023-07-10 NOTE — Progress Notes (Signed)
Follow-Up Visit   Subjective  Jack Lutz is a 72 y.o. 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 The patient has spots, moles and lesions to be evaluated, some may be new or changing and the patient may have concern these could be cancer.    The following portions of the chart were reviewed this encounter and updated as appropriate: medications, allergies, medical history  Review of Systems:  No other skin or systemic complaints except as noted in HPI or Assessment and Plan.  Objective  Well appearing patient in no apparent distress; mood and affect are within normal limits.  A full examination was performed including scalp, head, eyes, ears, nose, lips, neck, chest, axillae, abdomen, back, buttocks, bilateral upper extremities, bilateral lower extremities, hands, feet, fingers, toes, fingernails, and toenails. All findings within normal limits unless otherwise noted below.   Relevant physical exam findings are noted in the Assessment and Plan.  face x 2 (2) Pink scaly macules  face x 16, R shoulder x 1 (17) Stuck on waxy paps with erythema           Assessment & Plan   SKIN CANCER SCREENING PERFORMED TODAY.  ACTINIC DAMAGE - Chronic condition, secondary to cumulative UV/sun exposure - diffuse scaly erythematous macules with underlying dyspigmentation - Recommend daily broad spectrum sunscreen SPF 30+ to sun-exposed areas, reapply every 2 hours as needed.  - Staying in the shade or wearing long sleeves, sun glasses (UVA+UVB protection) and wide brim hats (4-inch brim around the entire circumference of the hat) are also recommended for sun protection.  - Call for new or changing lesions.  LENTIGINES, SEBORRHEIC KERATOSES,  HEMANGIOMAS - Benign normal skin lesions - Benign-appearing - Call for any changes  MELANOCYTIC NEVI - Tan-brown and/or pink-flesh-colored symmetric macules and papules - Benign appearing on exam today - Observation - Call clinic for new or changing moles - Recommend daily use of broad spectrum spf 30+ sunscreen to sun-exposed areas.   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 It can be infectious (bacterial, fungal) or noninfectious (shaving, tight clothing, heat/sweat, medications).  Folliculitis can be acute or chronic and recommended treatment depends on the underlying cause of folliculitis.  Rosacea is a chronic progressive skin condition usually affecting the face of adults, causing redness and/or acne bumps. It is treatable but not curable. It sometimes affects the eyes (ocular rosacea) as well. It may respond to topical and/or systemic medication and can flare with stress, sun exposure, alcohol, exercise, topical steroids (including hydrocortisone/cortisone 10) and some foods.  Daily application of broad spectrum spf 30+ sunscreen to face is recommended to reduce flares.   Treatment Plan: Cont Ketoconazole 2% shampoo 3d/wk to forehead and scalp Start Doxycycline 40mg  1 po qd with food and drink  Doxycycline should be taken with food to prevent nausea. Do not lay down for 30 minutes after taking. Be cautious with sun exposure and use good sun protection while on this medication. Pregnant women should not take this medication.    Long term medication management.  Patient is using long term (months to years) prescription medication  to control their  dermatologic condition.  These medications require periodic monitoring to evaluate for efficacy and side effects and may require periodic  laboratory monitoring.   AK (actinic keratosis) (2) face x 2  Actinic keratoses are precancerous spots that appear secondary to cumulative UV radiation exposure/sun exposure over time. They are chronic with expected duration over 1 year. A portion of actinic keratoses will progress to squamous cell carcinoma of the skin. It is not possible to reliably predict which spots will progress to skin cancer and so treatment is recommended to prevent development of skin cancer.  Recommend daily broad spectrum sunscreen SPF 30+ to sun-exposed areas, reapply every 2 hours as needed.  Recommend staying in the shade or wearing long sleeves, sun glasses (UVA+UVB protection) and wide brim hats (4-inch brim around the entire circumference of the hat). Call for new or changing lesions.  Destruction of lesion - face x 2 (2) Complexity: simple   Destruction method: cryotherapy   Informed consent: discussed and consent obtained   Timeout:  patient name, date of birth, surgical site, and procedure verified Lesion destroyed using liquid nitrogen: Yes   Region frozen until ice ball extended beyond lesion: Yes   Outcome: patient tolerated procedure well with no complications   Post-procedure details: wound care instructions given    Inflamed seborrheic keratosis (17) face x 16, R shoulder x 1  Symptomatic, irritating, patient would like treated.  Destruction of lesion - face x 16, R shoulder x 1 (17) Complexity: simple   Destruction method: cryotherapy   Informed consent: discussed and consent obtained   Timeout:  patient name, date of birth, surgical site, and procedure verified Lesion destroyed using liquid nitrogen: Yes   Region frozen until ice ball extended beyond lesion: Yes   Outcome: patient tolerated procedure well with no complications   Post-procedure details: wound care instructions given     Return in about 4 months (around 11/10/2023) for Ak f/u, Folliculitis/Rosacea f/u.  I, Ardis Rowan,  RMA, am acting as scribe for Armida Sans, MD .   Documentation: I have reviewed the above documentation for accuracy and completeness, and I agree with the above.  Armida Sans, MD

## 2023-07-11 ENCOUNTER — Other Ambulatory Visit: Payer: Self-pay

## 2023-07-11 MED ORDER — DOXYCYCLINE HYCLATE 20 MG PO TABS: 20.0000 mg | ORAL_TABLET | Freq: Every day | ORAL | 5 refills | Status: DC

## 2023-07-11 MED ORDER — DOXYCYCLINE HYCLATE 20 MG PO TABS: 20.0000 mg | ORAL_TABLET | Freq: Two times a day (BID) | ORAL | 5 refills | Status: DC

## 2023-07-11 NOTE — Addendum Note (Signed)
Addended by: Dorathy Daft R on: 07/11/2023 01:47 PM   Modules accepted: Orders

## 2023-07-11 NOTE — Progress Notes (Signed)
do

## 2023-08-08 ENCOUNTER — Other Ambulatory Visit: Payer: Self-pay

## 2023-08-08 MED ORDER — DOXYCYCLINE HYCLATE 20 MG PO TABS: 20.0000 mg | ORAL_TABLET | Freq: Two times a day (BID) | ORAL | 0 refills | Status: DC

## 2023-08-08 NOTE — Progress Notes (Signed)
90 day supply request from pharmacy. aw ?

## 2023-08-13 DIAGNOSIS — E7800 Pure hypercholesterolemia, unspecified: Secondary | ICD-10-CM | POA: Diagnosis not present

## 2023-08-13 DIAGNOSIS — R7309 Other abnormal glucose: Secondary | ICD-10-CM | POA: Diagnosis not present

## 2023-08-13 DIAGNOSIS — I10 Essential (primary) hypertension: Secondary | ICD-10-CM | POA: Diagnosis not present

## 2023-11-27 ENCOUNTER — Ambulatory Visit (INDEPENDENT_AMBULATORY_CARE_PROVIDER_SITE_OTHER): Payer: Medicare Other | Admitting: Dermatology

## 2023-11-27 DIAGNOSIS — Z79899 Other long term (current) drug therapy: Secondary | ICD-10-CM

## 2023-11-27 DIAGNOSIS — L821 Other seborrheic keratosis: Secondary | ICD-10-CM | POA: Diagnosis not present

## 2023-11-27 DIAGNOSIS — W908XXA Exposure to other nonionizing radiation, initial encounter: Secondary | ICD-10-CM | POA: Diagnosis not present

## 2023-11-27 DIAGNOSIS — L578 Other skin changes due to chronic exposure to nonionizing radiation: Secondary | ICD-10-CM | POA: Diagnosis not present

## 2023-11-27 DIAGNOSIS — L570 Actinic keratosis: Secondary | ICD-10-CM | POA: Diagnosis not present

## 2023-11-27 DIAGNOSIS — L739 Follicular disorder, unspecified: Secondary | ICD-10-CM | POA: Diagnosis not present

## 2023-11-27 DIAGNOSIS — D692 Other nonthrombocytopenic purpura: Secondary | ICD-10-CM | POA: Diagnosis not present

## 2023-11-27 DIAGNOSIS — L820 Inflamed seborrheic keratosis: Secondary | ICD-10-CM

## 2023-11-27 DIAGNOSIS — L719 Rosacea, unspecified: Secondary | ICD-10-CM

## 2023-11-27 DIAGNOSIS — Z7189 Other specified counseling: Secondary | ICD-10-CM

## 2023-11-27 MED ORDER — KETOCONAZOLE 2 % EX SHAM: 1.0000 | MEDICATED_SHAMPOO | CUTANEOUS | 11 refills | Status: DC

## 2023-11-27 MED ORDER — DOXYCYCLINE HYCLATE 20 MG PO TABS: 20.0000 mg | ORAL_TABLET | Freq: Two times a day (BID) | ORAL | 3 refills | Status: DC

## 2023-11-27 NOTE — Progress Notes (Unsigned)
 Follow-Up Visit   Subjective  Jack Lutz is a 73 y.o. male who presents for the following:  4 month ak and isk  follow up, hx of spots treated at face and right shoulder, hx of folliculitis, hx of rosacea Rosacea with folliculitis has improved on doxycycline 40 mg po qd and ketoconazole shampoo 3 x weekly to scalp and forehead.   The patient has spots, moles and lesions to be evaluated, some may be new or changing and the patient may have concern these could be cancer.  The following portions of the chart were reviewed this encounter and updated as appropriate: medications, allergies, medical history  Review of Systems:  No other skin or systemic complaints except as noted in HPI or Assessment and Plan.  Objective  Well appearing patient in no apparent distress; mood and affect are within normal limits.  A focused examination was performed of the following areas: Scalp, face, b/l arms, b/l hands  Relevant exam findings are noted in the Assessment and Plan. right forehead x 2 (2) Erythematous thin papules/macules with gritty scale.  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 Currently well-controlled. Improved on doxycycline     Folliculitis occurs due to inflammation of the superficial hair follicle (pore), resulting in acne-like lesions (pus bumps). It can be infectious (bacterial, fungal) or noninfectious (shaving, tight clothing, heat/sweat, medications).  Folliculitis can be acute or chronic and recommended treatment depends on the underlying cause of folliculitis.   Rosacea is a chronic progressive skin condition usually affecting the face of adults, causing redness and/or acne bumps. It is treatable but not curable. It  sometimes affects the eyes (ocular rosacea) as well. It may respond to topical and/or systemic medication and can flare with stress, sun exposure, alcohol, exercise, topical steroids (including hydrocortisone/cortisone 10) and some foods.  Daily application of broad spectrum spf 30+ sunscreen to face is recommended to reduce flares.    Treatment Plan: Cont Ketoconazole 2% shampoo 3d/wk to forehead and scalp Continue Doxycycline 40mg  1 po qd with food and drink, discussed can decrease to 3 pills by mouth weekly if desired.     Doxycycline should be taken with food to prevent nausea. Do not lay down for 30 minutes after taking. Be cautious with sun exposure and use good sun protection while on this medication. Pregnant women should not take this medication.     Long term medication management.  Patient is using long term (months to years) prescription medication  to control their dermatologic condition.  These medications require periodic monitoring to evaluate for efficacy and side effects and may require periodic laboratory monitoring.   SEBORRHEIC KERATOSIS - Stuck-on, waxy, tan-brown papules and/or plaques  - Benign-appearing - Discussed benign etiology and prognosis. - Observe - Call for any changes  Purpura - Chronic; persistent and recurrent.  Treatable, but not curable. - Violaceous macules and patches - Benign - Related to trauma, age, sun damage and/or use of blood thinners, chronic use of topical and/or oral steroids - Observe - Can use OTC arnica containing moisturizer such as Dermend Bruise Formula if desired - Call for worsening or other concerns  ACTINIC DAMAGE - chronic, secondary to cumulative UV radiation exposure/sun exposure over time - diffuse scaly erythematous  macules with underlying dyspigmentation - Recommend daily broad spectrum sunscreen SPF 30+ to sun-exposed areas, reapply every 2 hours as needed.  - Recommend staying in the shade or wearing long sleeves, sun  glasses (UVA+UVB protection) and wide brim hats (4-inch brim around the entire circumference of the hat). - Call for new or changing lesions.  ACTINIC KERATOSIS (2) right forehead x 2 (2) Actinic keratoses are precancerous spots that appear secondary to cumulative UV radiation exposure/sun exposure over time. They are chronic with expected duration over 1 year. A portion of actinic keratoses will progress to squamous cell carcinoma of the skin. It is not possible to reliably predict which spots will progress to skin cancer and so treatment is recommended to prevent development of skin cancer.  Recommend daily broad spectrum sunscreen SPF 30+ to sun-exposed areas, reapply every 2 hours as needed.  Recommend staying in the shade or wearing long sleeves, sun glasses (UVA+UVB protection) and wide brim hats (4-inch brim around the entire circumference of the hat). Call for new or changing lesions. Destruction of lesion - right forehead x 2 (2) Complexity: simple   Destruction method: cryotherapy   Informed consent: discussed and consent obtained   Timeout:  patient name, date of birth, surgical site, and procedure verified Lesion destroyed using liquid nitrogen: Yes   Region frozen until ice ball extended beyond lesion: Yes   Outcome: patient tolerated procedure well with no complications   Post-procedure details: wound care instructions given   INFLAMED SEBORRHEIC KERATOSIS (5) right cheek x 2, left forearm x 1 , right temple x 2 (5) Symptomatic, irritating, patient would like treated. Destruction of lesion - right cheek x 2, left forearm x 1 , right temple x 2 (5) Complexity: simple   Destruction method: cryotherapy   Informed consent: discussed and consent obtained   Timeout:  patient name, date of birth, surgical site, and procedure verified Lesion destroyed using liquid nitrogen: Yes   Region frozen until ice ball extended beyond lesion: Yes   Outcome: patient tolerated procedure well  with no complications   Post-procedure details: wound care instructions given   FOLLICULITIS   Related Medications ketoconazole (NIZORAL) 2 % shampoo Apply 1 Application topically 3 (three) times a week. Wash forehead and scalp 3 times weekly, let sit 5 minutes and rinse out ROSACEA   Related Medications doxycycline (PERIOSTAT) 20 MG tablet Take 1 tablet (20 mg total) by mouth 2 (two) times daily. Take with food and fluids.  Return in about 1 year (around 11/26/2024) for rosacea/folliculitis/ ak and isk follow up.  IAsher Muir, CMA, am acting as scribe for Armida Sans, MD.   Documentation: I have reviewed the above documentation for accuracy and completeness, and I agree with the above.  Armida Sans, MD

## 2023-11-27 NOTE — Patient Instructions (Addendum)
 Can continue doxycycline 40 mg by mouth daily but can decrease to 3 times weekly as desired  Doxycycline should be taken with food to prevent nausea. Do not lay down for 30 minutes after taking. Be cautious with sun exposure and use good sun protection while on this medication. Pregnant women should not take this medication.   Continue ketoconazole shampoo - use a wash at forehead and scalp 3 times weekly    Seborrheic Keratosis  What causes seborrheic keratoses? Seborrheic keratoses are harmless, common skin growths that first appear during adult life.  As time goes by, more growths appear.  Some people may develop a large number of them.  Seborrheic keratoses appear on both covered and uncovered body parts.  They are not caused by sunlight.  The tendency to develop seborrheic keratoses can be inherited.  They vary in color from skin-colored to gray, brown, or even black.  They can be either smooth or have a rough, warty surface.   Seborrheic keratoses are superficial and look as if they were stuck on the skin.  Under the microscope this type of keratosis looks like layers upon layers of skin.  That is why at times the top layer may seem to fall off, but the rest of the growth remains and re-grows.    Treatment Seborrheic keratoses do not need to be treated, but can easily be removed in the office.  Seborrheic keratoses often cause symptoms when they rub on clothing or jewelry.  Lesions can be in the way of shaving.  If they become inflamed, they can cause itching, soreness, or burning.  Removal of a seborrheic keratosis can be accomplished by freezing, burning, or surgery. If any spot bleeds, scabs, or grows rapidly, please return to have it checked, as these can be an indication of a skin cancer.   Cryotherapy Aftercare  Wash gently with soap and water everyday.   Apply Vaseline and Band-Aid daily until healed.  Actinic keratoses are precancerous spots that appear secondary to cumulative  UV radiation exposure/sun exposure over time. They are chronic with expected duration over 1 year. A portion of actinic keratoses will progress to squamous cell carcinoma of the skin. It is not possible to reliably predict which spots will progress to skin cancer and so treatment is recommended to prevent development of skin cancer.  Recommend daily broad spectrum sunscreen SPF 30+ to sun-exposed areas, reapply every 2 hours as needed.  Recommend staying in the shade or wearing long sleeves, sun glasses (UVA+UVB protection) and wide brim hats (4-inch brim around the entire circumference of the hat). Call for new or changing lesions.     Due to recent changes in healthcare laws, you may see results of your pathology and/or laboratory studies on MyChart before the doctors have had a chance to review them. We understand that in some cases there may be results that are confusing or concerning to you. Please understand that not all results are received at the same time and often the doctors may need to interpret multiple results in order to provide you with the best plan of care or course of treatment. Therefore, we ask that you please give [place] 2 business days to thoroughly review all your results before contacting the office for clarification. Should we see a critical lab result, you will be contacted sooner.   If You Need Anything After Your Visit  If you have any questions or concerns for your doctor, please call our main line at [phone] and  press option 4 to reach your doctor's medical assistant. If no one answers, please leave a voicemail as directed and we will return your call as soon as possible. Messages left after 4 pm will be answered the following business day.   You may also send [place] a message via MyChart. We typically respond to MyChart messages within 1-2 business days.  For prescription refills, please ask your pharmacy to contact our office. Our fax number is [phone].  If you  have an urgent issue when the clinic is closed that cannot wait until the next business day, you can page your doctor at the number below.    Please note that while we do our best to be available for urgent issues outside of office hours, we are not available 24/7.   If you have an urgent issue and are unable to reach [place], you may choose to seek medical care at your doctor's office, retail clinic, urgent care center, or emergency room.  If you have a medical emergency, please immediately call 911 or go to the emergency department.  Pager Numbers  - Dr. Gwen Pounds: [phone]  - Dr. Roseanne Reno: [phone]  - Dr. Katrinka Blazing: [phone]   In the event of inclement weather, please call our main line at [phone] for an update on the status of any delays or closures.  Dermatology Medication Tips: Please keep the boxes that topical medications come in in order to help keep track of the instructions about where and how to use these. Pharmacies typically print the medication instructions only on the boxes and not directly on the medication tubes.   If your medication is too expensive, please contact our office at [phone] option 4 or send [place] a message through MyChart.   We are unable to tell what your co-pay for medications will be in advance as this is different depending on your insurance coverage. However, we may be able to find a substitute medication at lower cost or fill out paperwork to get insurance to cover a needed medication.   If a prior authorization is required to get your medication covered by your insurance company, please allow [place] 1-2 business days to complete this process.  Drug prices often vary depending on where the prescription is filled and some pharmacies may offer cheaper prices.  The website www.goodrx.com contains coupons for medications through different pharmacies. The prices here do not account for what the cost may be with help from insurance (it may be cheaper  with your insurance), but the website can give you the price if you did not use any insurance.  - You can print the associated coupon and take it with your prescription to the pharmacy.  - You may also stop by our office during regular business hours and pick up a GoodRx coupon card.  - If you need your prescription sent electronically to a different pharmacy, notify our office through [facility] or by phone at [phone] option 4.     Si Usted Necesita Algo Despus de Su Visita  Tambin puede enviarnos un mensaje a travs de Clinical cytogeneticist. Por lo general respondemos a los mensajes de MyChart en el transcurso de 1 a 2 das hbiles.  Para renovar recetas, por favor pida a su farmacia que se ponga en contacto con nuestra oficina. Annie Sable de fax es [place] [phone].  Si tiene un asunto urgente cuando la clnica est cerrada y que no puede esperar hasta el siguiente da hbil, puede llamar/localizar a su doctor(a) al  nmero que aparece a continuacin.   Por favor, tenga en cuenta que aunque hacemos todo lo posible para estar disponibles para asuntos urgentes fuera del horario de [place], no estamos disponibles las 24 horas del da, los 7 [address]  [address] de la [place].   Si tiene un problema urgente y no puede comunicarse con nosotros, puede optar por buscar atencin mdica  en el consultorio de su doctor(a), en una clnica privada, en un centro de atencin urgente o en una sala de emergencias.  Si tiene Engineer, drilling, por favor llame inmediatamente al 911 o vaya a la sala de emergencias.  Nmeros de bper  - Dr. Gwen Pounds: [phone]  - Dra. Roseanne Reno: [phone]  - Dr. Katrinka Blazing: [phone]   En caso de inclemencias del tiempo, por favor llame a Lacy Duverney principal al [phone] para una actualizacin sobre el [place] de cualquier retraso o cierre.  Consejos para la medicacin en dermatologa: Por favor, guarde las cajas en las que vienen los medicamentos de uso tpico para  ayudarle a seguir las instrucciones sobre dnde y cmo usarlos. Las farmacias generalmente imprimen las instrucciones del medicamento slo en las cajas y no directamente en los tubos del [place].   Si su medicamento es muy caro, por favor, pngase en contacto con Rolm Gala llamando al [phone] y presione la opcin 4 o envenos un mensaje a travs de Clinical cytogeneticist.   No podemos decirle cul ser su copago por los medicamentos por adelantado ya que esto es diferente dependiendo de la cobertura de su seguro. Sin embargo, es posible que podamos encontrar un medicamento sustituto a Audiological scientist un formulario para que el seguro cubra el medicamento que se considera necesario.   Si se requiere una autorizacin previa para que su compaa de seguros [place] su medicamento, por favor permtanos de 1 a 2 das hbiles para completar [address]  Los precios de los medicamentos varan con frecuencia dependiendo del Environmental consultant de dnde se surte la receta y alguna farmacias pueden ofrecer precios ms baratos.  El sitio web www.goodrx.com tiene cupones para medicamentos de Health and safety inspector. Los precios aqu no tienen en cuenta lo que podra costar con la ayuda del seguro (puede ser ms barato con su seguro), pero el sitio web puede darle el precio si no utiliz Tourist information centre manager.  - Puede imprimir el cupn correspondiente y llevarlo con su receta a la farmacia.  - Tambin puede pasar por nuestra oficina durante el horario de atencin regular y Education officer, museum una tarjeta de cupones de GoodRx.  - Si necesita que su receta se enve electrnicamente a una farmacia diferente, informe a nuestra oficina a travs de MyChart de [place] o por telfono llamando al [phone] y presione la opcin 4.

## 2023-11-28 ENCOUNTER — Other Ambulatory Visit: Payer: Self-pay | Admitting: Dermatology

## 2023-11-28 ENCOUNTER — Encounter: Payer: Self-pay | Admitting: Dermatology

## 2023-11-28 DIAGNOSIS — L719 Rosacea, unspecified: Secondary | ICD-10-CM

## 2023-12-05 ENCOUNTER — Other Ambulatory Visit: Payer: Self-pay

## 2023-12-05 DIAGNOSIS — L739 Follicular disorder, unspecified: Secondary | ICD-10-CM

## 2023-12-05 MED ORDER — KETOCONAZOLE 2 % EX SHAM: 1.0000 | MEDICATED_SHAMPOO | CUTANEOUS | 11 refills | Status: AC

## 2023-12-05 NOTE — Progress Notes (Signed)
 Refill request faxed from pharmacy.

## 2024-11-26 ENCOUNTER — Ambulatory Visit: Payer: BLUE CROSS/BLUE SHIELD | Admitting: Dermatology
